# Patient Record
Sex: Female | Born: 1985 | Race: White | Hispanic: No | Marital: Married | Smoking: Never smoker
Health system: Southern US, Community
[De-identification: ages and names within clinical notes are randomized; demographics above are authoritative.]

## PROBLEM LIST (undated history)

## (undated) DIAGNOSIS — F32A Depression, unspecified: Secondary | ICD-10-CM

## (undated) DIAGNOSIS — Z789 Other specified health status: Secondary | ICD-10-CM

## (undated) HISTORY — PX: WISDOM TOOTH EXTRACTION: SHX21

---

## 2018-09-16 ENCOUNTER — Other Ambulatory Visit: Payer: Self-pay | Admitting: Family Medicine

## 2018-09-16 DIAGNOSIS — R1013 Epigastric pain: Secondary | ICD-10-CM

## 2018-09-23 ENCOUNTER — Ambulatory Visit
Admission: RE | Admit: 2018-09-23 | Discharge: 2018-09-23 | Disposition: A | Discharge status: Home / Self Care | Payer: BLUE CROSS/BLUE SHIELD | Source: Ambulatory Visit | Attending: Family Medicine | Admitting: Family Medicine

## 2018-09-23 DIAGNOSIS — R1013 Epigastric pain: Secondary | ICD-10-CM | POA: Diagnosis not present

## 2020-04-11 ENCOUNTER — Other Ambulatory Visit: Payer: Self-pay | Admitting: Obstetrics and Gynecology

## 2020-04-24 NOTE — H&P (Signed)
[name] is a 34 y.o. female here for Sono guided IUD removal . Pt not seen since Sept 2020 and she is getting married in Sept 2021 .  She has a Mirena placed 5 yrs ago . No reported difficulty with placement   Past Medical History:  has a past medical history of Thyroid disease.  Past Surgical History:  has a past surgical history that includes Colposcopy. Family History: family history includes Breast cancer (age of onset: 73) in her mother; Lung cancer in her maternal grandmother. Social History:  reports that she has never smoked. She has never used smokeless tobacco. She reports current alcohol use. She reports that she does not use drugs. OB/GYN History:          OB History    Gravida  0   Para  0   Term  0   Preterm  0   AB  0   Living  0     SAB  0   TAB  0   Ectopic  0   Molar  0   Multiple  0   Live Births  0          Allergies: has No Known Allergies. Medications:  Current Outpatient Medications:  .  acyclovir (ZOVIRAX) 400 MG tablet, TAKE 1 TABLET BY MOUTH ONCE DAILY, Disp: 90 tablet, Rfl: 3 .  ibuprofen (MOTRIN) 200 MG tablet, Take 200 mg by mouth every 6 (six) hours as needed for Pain, Disp: , Rfl:  .  levonorgestrel (MIRENA 52 MG) 20 mcg/24 hr (5 years) IUD, Insert 1 each into the uterus once Follow package directions. Placed 07/2015  , Disp: , Rfl:   Review of Systems: General:                      No fatigue or weight loss Eyes:                           No vision changes Ears:                            No hearing difficulty Respiratory:                No cough or shortness of breath Pulmonary:                  No asthma or shortness of breath Cardiovascular:           No chest pain, palpitations, dyspnea on exertion Gastrointestinal:          No abdominal bloating, chronic diarrhea, constipations, masses, pain or hematochezia Genitourinary:             No hematuria, dysuria, abnormal vaginal discharge, pelvic pain,  Menometrorrhagia Lymphatic:                   No swollen lymph nodes Musculoskeletal:         No muscle weakness Neurologic:                  No extremity weakness, syncope, seizure disorder Psychiatric:                  No history of depression, delusions or suicidal/homicidal ideation    Exam:      Vitals:   04/09/20 1607  BP: (!) 123/101  Pulse:     Body mass index  is 22.42 kg/m.  WDWN white female in NAD   Lungs: CTA  CV : RRR without murmur   Abdomen: soft , no mass, normal active bowel sounds,  non-tender, no rebound tenderness Pelvic: tanner stage 5 ,  External genitalia: vulva /labia no lesions Urethra: no prolapse Vagina: normal physiologic d/c Cervix: no lesions, no cervical motion tenderness Cervix cannulated  And strings not retrieved . IUD + randall stone used . Cervical dilators used as well . After several attempts all with u/s guidance the iud could not be removed .    Uterus: normal size shape and contour, non-tender Adnexa: no mass,  non-tender   Rectovaginal:  U/S: Result status: In process  Endometrium= iud seen in fundus    Guidance for removal of iud   iud Not retrieved     Impression:   The encounter diagnosis was Retained intrauterine contraceptive device (IUD).    Plan:   Unsuccessful retrieval of IUD  Spoke to her about removal in the OR where the patient will be more comfortable      Gaspar Fowle Elenora Gamma, MD

## 2020-04-30 ENCOUNTER — Other Ambulatory Visit: Payer: Self-pay

## 2020-04-30 ENCOUNTER — Encounter: Payer: Self-pay | Admitting: *Deleted

## 2020-04-30 ENCOUNTER — Encounter
Admission: RE | Admit: 2020-04-30 | Discharge: 2020-04-30 | Disposition: A | Discharge status: Home / Self Care | Payer: BC Managed Care – PPO | Source: Ambulatory Visit | Attending: Obstetrics and Gynecology | Admitting: Obstetrics and Gynecology

## 2020-04-30 HISTORY — DX: Other specified health status: Z78.9

## 2020-04-30 NOTE — Patient Instructions (Signed)
Your procedure is scheduled on: 05-04-20 FRIDAY Report to Same Day Surgery 2nd floor medical mall [facility] Entrance-take elevator on left to 2nd floor.  Check in with surgery information desk.) To find out your arrival time please call [phone] between 1PM - 3PM on 05-03-20 THURSDAY  Remember: Instructions that are not followed completely may result in serious medical risk, up to and including death, or upon the discretion of your surgeon and anesthesiologist your surgery may need to be rescheduled.    _x___ 1. Do not eat food after midnight the night before your procedure. NO GUM OR CANDY AFTER MIDNIGHT. You may drink clear liquids up to 2 hours before you are scheduled to arrive at the hospital for your procedure.  Do not drink clear liquids within 2 hours of your scheduled arrival to the hospital.  Clear liquids include  --Water or Apple juice without pulp  --Gatorade  --Black Coffee or Clear Tea (No milk, no creamers, do not add anything to the coffee or Tea  _X___Ensure clear carbohydrate drink-FINISH DRINK 2 HOURS PRIOR TO ARRIVAL TIME TO HOSPITAL THE DAY OF YOUR SURGERY    __x__ 2. No Alcohol for 24 hours before or after surgery.   __x__3. No Smoking or e-cigarettes for 24 prior to surgery.  Do not use any chewable tobacco products for at least 6 hour prior to surgery   ____  4. Bring all medications with you on the day of surgery if instructed.    __x__ 5. Notify your doctor if there is any change in your medical condition     (cold, fever, infections).    x___6. On the morning of surgery brush your teeth with toothpaste and water.  You may rinse your mouth with mouth wash if you wish.  Do not swallow any toothpaste or mouthwash.   Do not wear jewelry, make-up, hairpins, clips or nail polish.  Do not wear lotions, powders, or perfumes. You may wear deodorant.  Do not shave 48 hours prior to surgery. Men may shave face and neck.  Do not bring valuables to the hospital.     [facility] is not responsible for any belongings or valuables.               Contacts, dentures or bridgework may not be worn into surgery.  Leave your suitcase in the car. After surgery it may be brought to your room.  For patients admitted to the hospital, discharge time is determined by your  treatment team.  _  Patients discharged the day of surgery will not be allowed to drive home.  You will need someone to drive you home and stay with you the night of your procedure.    Please read over the following fact sheets that you were given:   [facility] Preparing for Surgery/INCENTIVE SPIROMETER INSTRUCTIONS  ____ TAKE THE FOLLOWING MEDICATION THE MORNING OF SURGERY WITH A SMALL SIP OF WATER. These include:  1. NONE  2.  3.  4.  5.  6.  ____Fleets enema or Magnesium Citrate as directed.   ____ Use CHG Soap or sage wipes as directed on instruction sheet   ____ Use inhalers on the day of surgery and bring to hospital day of surgery  ____ Stop Metformin and Janumet 2 days prior to surgery.    ____ Take 1/2 of usual insulin dose the night before surgery and none on the morning surgery.   ____ Follow recommendations from Cardiologist, Pulmonologist or PCP regarding stopping  Aspirin, Coumadin, Plavix ,Eliquis, Effient, or Pradaxa, and Pletal.  X____Stop Anti-inflammatories such as Advil, Aleve, Ibuprofen, Motrin, Naproxen, Naprosyn, Goodies powders or aspirin products NOW-OK to take Tylenol   ____ Stop supplements until after surgery.    ____ Bring C-Pap to the hospital.

## 2020-05-02 ENCOUNTER — Other Ambulatory Visit
Admission: RE | Admit: 2020-05-02 | Discharge: 2020-05-02 | Disposition: A | Discharge status: Home / Self Care | Payer: BC Managed Care – PPO | Source: Ambulatory Visit | Attending: Obstetrics and Gynecology | Admitting: Obstetrics and Gynecology

## 2020-05-02 ENCOUNTER — Other Ambulatory Visit: Payer: Self-pay

## 2020-05-02 DIAGNOSIS — Z30432 Encounter for removal of intrauterine contraceptive device: Secondary | ICD-10-CM | POA: Diagnosis not present

## 2020-05-02 DIAGNOSIS — Z20822 Contact with and (suspected) exposure to covid-19: Secondary | ICD-10-CM | POA: Insufficient documentation

## 2020-05-02 DIAGNOSIS — Z01812 Encounter for preprocedural laboratory examination: Secondary | ICD-10-CM | POA: Insufficient documentation

## 2020-05-02 DIAGNOSIS — Z79899 Other long term (current) drug therapy: Secondary | ICD-10-CM | POA: Diagnosis not present

## 2020-05-02 LAB — BASIC METABOLIC PANEL
Anion gap: 10 (ref 5–15)
BUN: 15 mg/dL (ref 6–20)
CO2: 23 mmol/L (ref 22–32)
Calcium: 9.2 mg/dL (ref 8.9–10.3)
Chloride: 105 mmol/L (ref 98–111)
Creatinine, Ser: 0.83 mg/dL (ref 0.44–1.00)
GFR calc Af Amer: >60 mL/min (ref >60)
GFR calc non Af Amer: >60 mL/min (ref >60)
Glucose, Bld: 87 mg/dL (ref 70–99)
Potassium: 3.4 mmol/L — ABNORMAL LOW (ref 3.5–5.1)
Sodium: 138 mmol/L (ref 135–145)

## 2020-05-02 LAB — CBC
HCT: 37.7 % (ref 36.0–46.0)
Hemoglobin: 12.4 g/dL (ref 12.0–15.0)
MCH: 29.9 pg (ref 26.0–34.0)
MCHC: 32.9 g/dL (ref 30.0–36.0)
MCV: 90.8 fL (ref 80.0–100.0)
Platelets: 255 10*3/uL (ref 150–400)
RBC: 4.15 MIL/uL (ref 3.87–5.11)
RDW: 12.5 % (ref 11.5–15.5)
WBC: 6.5 10*3/uL (ref 4.0–10.5)
nRBC: 0 % (ref 0.0–0.2)

## 2020-05-02 LAB — TYPE AND SCREEN
ABO/RH(D): A POS
Antibody Screen: NEGATIVE

## 2020-05-02 LAB — SARS CORONAVIRUS 2 (TAT 6-24 HRS): SARS Coronavirus 2: NEGATIVE

## 2020-05-04 ENCOUNTER — Encounter: Payer: Self-pay | Admitting: Obstetrics and Gynecology

## 2020-05-04 ENCOUNTER — Ambulatory Visit: Payer: BC Managed Care – PPO | Admitting: Family

## 2020-05-04 ENCOUNTER — Ambulatory Visit
Admission: RE | Admit: 2020-05-04 | Discharge: 2020-05-04 | Disposition: A | Discharge status: Home / Self Care | Payer: BC Managed Care – PPO | Source: Ambulatory Visit | Attending: Obstetrics and Gynecology | Admitting: Obstetrics and Gynecology

## 2020-05-04 ENCOUNTER — Other Ambulatory Visit: Payer: Self-pay

## 2020-05-04 ENCOUNTER — Encounter
Admission: RE | Disposition: A | Discharge status: Home / Self Care | Payer: Self-pay | Source: Ambulatory Visit | Attending: Obstetrics and Gynecology

## 2020-05-04 DIAGNOSIS — Z79899 Other long term (current) drug therapy: Secondary | ICD-10-CM | POA: Insufficient documentation

## 2020-05-04 DIAGNOSIS — Z30432 Encounter for removal of intrauterine contraceptive device: Secondary | ICD-10-CM | POA: Insufficient documentation

## 2020-05-04 HISTORY — PX: HYSTEROSCOPY: SHX211

## 2020-05-04 HISTORY — PX: IUD REMOVAL: SHX5392

## 2020-05-04 LAB — POCT PREGNANCY, URINE: Preg Test, Ur: NEGATIVE

## 2020-05-04 SURGERY — REMOVAL, INTRAUTERINE DEVICE
Anesthesia: General | Site: Uterus

## 2020-05-04 MED ORDER — FENTANYL CITRATE (PF) 100 MCG/2ML IJ SOLN
INTRAMUSCULAR | Status: AC
  Filled 2020-05-04: qty 2

## 2020-05-04 MED ORDER — FAMOTIDINE 20 MG PO TABS: 20.0000 mg | ORAL_TABLET | Freq: Once | ORAL | Status: DC

## 2020-05-04 MED ORDER — FENTANYL CITRATE (PF) 100 MCG/2ML IJ SOLN: 25.0000 ug | INTRAMUSCULAR | Status: DC | PRN

## 2020-05-04 MED ORDER — LACTATED RINGERS IV SOLN: INTRAVENOUS | Status: DC

## 2020-05-04 MED ORDER — PROPOFOL 10 MG/ML IV BOLUS
INTRAVENOUS | Status: AC
  Filled 2020-05-04: qty 40

## 2020-05-04 MED ORDER — SILVER NITRATE-POT NITRATE 75-25 % EX MISC
CUTANEOUS | Status: AC
  Filled 2020-05-04: qty 10

## 2020-05-04 MED ORDER — MIDAZOLAM HCL 2 MG/2ML IJ SOLN
INTRAMUSCULAR | Status: AC
  Filled 2020-05-04: qty 2

## 2020-05-04 MED ORDER — MIDAZOLAM HCL 2 MG/2ML IJ SOLN
INTRAMUSCULAR | Status: DC | PRN
  Administered 2020-05-04: 2 mg via INTRAVENOUS

## 2020-05-04 MED ORDER — KETOROLAC TROMETHAMINE 30 MG/ML IJ SOLN
INTRAMUSCULAR | Status: AC
  Filled 2020-05-04: qty 1

## 2020-05-04 MED ORDER — KETOROLAC TROMETHAMINE 30 MG/ML IJ SOLN
INTRAMUSCULAR | Status: DC | PRN
  Administered 2020-05-04: 30 mg via INTRAVENOUS

## 2020-05-04 MED ORDER — FERRIC SUBSULFATE 259 MG/GM EX SOLN
CUTANEOUS | Status: AC
  Filled 2020-05-04: qty 8

## 2020-05-04 MED ORDER — ONDANSETRON HCL 4 MG/2ML IJ SOLN
INTRAMUSCULAR | Status: DC | PRN
  Administered 2020-05-04: 4 mg via INTRAVENOUS

## 2020-05-04 MED ORDER — LACTATED RINGERS IR SOLN
Status: DC | PRN
  Administered 2020-05-04: 3000 mL

## 2020-05-04 MED ORDER — CHLORHEXIDINE GLUCONATE 0.12 % MT SOLN
OROMUCOSAL | Status: AC
  Filled 2020-05-04: qty 15

## 2020-05-04 MED ORDER — ONDANSETRON HCL 4 MG/2ML IJ SOLN
INTRAMUSCULAR | Status: AC
  Filled 2020-05-04: qty 2

## 2020-05-04 MED ORDER — POVIDONE-IODINE 10 % EX SWAB: 2.0000 "application " | Freq: Once | CUTANEOUS | Status: DC

## 2020-05-04 MED ORDER — CHLORHEXIDINE GLUCONATE 0.12 % MT SOLN
15.0000 mL | Freq: Once | OROMUCOSAL | Status: AC
  Administered 2020-05-04: 15 mL via OROMUCOSAL

## 2020-05-04 MED ORDER — FENTANYL CITRATE (PF) 100 MCG/2ML IJ SOLN
INTRAMUSCULAR | Status: AC
  Administered 2020-05-04: 25 ug via INTRAVENOUS
  Filled 2020-05-04: qty 2

## 2020-05-04 MED ORDER — FENTANYL CITRATE (PF) 100 MCG/2ML IJ SOLN
INTRAMUSCULAR | Status: DC | PRN
  Administered 2020-05-04 (×2): 50 ug via INTRAVENOUS

## 2020-05-04 MED ORDER — DEXAMETHASONE SODIUM PHOSPHATE 10 MG/ML IJ SOLN
INTRAMUSCULAR | Status: DC | PRN
  Administered 2020-05-04: 5 mg via INTRAVENOUS

## 2020-05-04 MED ORDER — ORAL CARE MOUTH RINSE: 15.0000 mL | Freq: Once | OROMUCOSAL | Status: AC

## 2020-05-04 MED ORDER — ONDANSETRON HCL 4 MG/2ML IJ SOLN: 4.0000 mg | Freq: Once | INTRAMUSCULAR | Status: DC | PRN

## 2020-05-04 SURGICAL SUPPLY — 19 items
APPLICATOR SWAB PROCTO LG 16IN (MISCELLANEOUS) ×2 IMPLANT
CANISTER SUCT 3000ML PPV (MISCELLANEOUS) ×2 IMPLANT
CATH ROBINSON RED A/P 16FR (CATHETERS) ×2 IMPLANT
COVER WAND RF STERILE (DRAPES) ×2 IMPLANT
ELECT REM PT RETURN 9FT ADLT (ELECTROSURGICAL) ×2
ELECTRODE REM PT RTRN 9FT ADLT (ELECTROSURGICAL) ×1 IMPLANT
GLOVE SURG SYN 8.0 (GLOVE) ×2 IMPLANT
GOWN STRL REUS W/ TWL LRG LVL3 (GOWN DISPOSABLE) ×1 IMPLANT
GOWN STRL REUS W/ TWL XL LVL3 (GOWN DISPOSABLE) ×1 IMPLANT
GOWN STRL REUS W/TWL LRG LVL3 (GOWN DISPOSABLE) ×2
GOWN STRL REUS W/TWL XL LVL3 (GOWN DISPOSABLE) ×2
IV LACTATED RINGER IRRG 3000ML (IV SOLUTION) ×2
IV LR IRRIG 3000ML ARTHROMATIC (IV SOLUTION) ×1 IMPLANT
KIT TURNOVER CYSTO (KITS) ×2 IMPLANT
PACK DNC HYST (MISCELLANEOUS) ×2 IMPLANT
PAD OB MATERNITY 4.3X12.25 (PERSONAL CARE ITEMS) ×2 IMPLANT
PAD PREP 24X41 OB/GYN DISP (PERSONAL CARE ITEMS) ×2 IMPLANT
TOWEL OR 17X26 4PK STRL BLUE (TOWEL DISPOSABLE) ×2 IMPLANT
TUBING CONNECTING 10 (TUBING) ×2 IMPLANT

## 2020-05-04 NOTE — Progress Notes (Signed)
Pt here for cervical dilation and removal of IUD   LAbs reviewed  All questions answered .. proceed

## 2020-05-04 NOTE — Anesthesia Preprocedure Evaluation (Signed)
Anesthesia Evaluation  Patient identified by MRN, date of birth, ID band Patient awake    Reviewed: Allergy & Precautions, NPO status , Patient's Chart, lab work & pertinent test results  History of Anesthesia Complications Negative for: history of anesthetic complications  Airway Mallampati: II       Dental   Pulmonary neg sleep apnea, neg COPD, Not current smoker,           Cardiovascular (-) hypertension(-) Past MI and (-) CHF (-) dysrhythmias (-) Valvular Problems/Murmurs     Neuro/Psych neg Seizures    GI/Hepatic Neg liver ROS, neg GERD  ,  Endo/Other  neg diabetes  Renal/GU negative Renal ROS     Musculoskeletal   Abdominal   Peds  Hematology   Anesthesia Other Findings   Reproductive/Obstetrics                             Anesthesia Physical Anesthesia Plan  ASA: I  Anesthesia Plan: General   Post-op Pain Management:    Induction: Intravenous  PONV Risk Score and Plan: 3 and Ondansetron, Dexamethasone and Midazolam  Airway Management Planned: LMA  Additional Equipment:   Intra-op Plan:   Post-operative Plan:   Informed Consent: I have reviewed the patients History and Physical, chart, labs and discussed the procedure including the risks, benefits and alternatives for the proposed anesthesia with the patient or authorized representative who has indicated his/her understanding and acceptance.       Plan Discussed with:   Anesthesia Plan Comments:         Anesthesia Quick Evaluation  

## 2020-05-04 NOTE — Anesthesia Postprocedure Evaluation (Signed)
Anesthesia Post Note  Patient: Samantha Newman  Procedure(s) Performed: INTRAUTERINE DEVICE (IUD) REMOVAL (N/A Uterus) HYSTEROSCOPY (N/A Uterus)  Patient location during evaluation: PACU Anesthesia Type: General Level of consciousness: awake and alert Pain management: pain level controlled Vital Signs Assessment: post-procedure vital signs reviewed and stable Respiratory status: spontaneous breathing and respiratory function stable Cardiovascular status: stable Anesthetic complications: no   No complications documented.   Last Vitals:  Vitals:   05/04/20 1117 05/04/20 1122  BP:    Pulse: 61 54  Resp: 13 12  Temp:    SpO2: 100% 100%    Last Pain:  Vitals:   05/04/20 1122  TempSrc:   PainSc: 2                  Kadedra Vanaken K

## 2020-05-04 NOTE — Brief Op Note (Signed)
05/04/2020  10:56 AM  PATIENT:  Lyndel Pleasure  34 y.o. female  PRE-OPERATIVE DIAGNOSIS:  endometrial IUD retained  POST-OPERATIVE DIAGNOSIS:  endometrial IUD retained  PROCEDURE:  Procedure(s): INTRAUTERINE DEVICE (IUD) REMOVAL (N/A) HYSTEROSCOPY (N/A) Cervical dilation SURGEON:  Surgeon(s) and Role:    * Zalmen Wrightsman, Ihor Austin, MD - Primary  PHYSICIAN ASSISTANT: RN  ASSISTANTS: none   ANESTHESIA:   general  EBL:  5 mL IOF 750 cc  UO 150 cc  BLOOD ADMINISTERED:none  DRAINS: none   LOCAL MEDICATIONS USED:  NONE  SPECIMEN:  No Specimen, Picture of IUD taken out of body    DISPOSITION OF SPECIMEN:  N/A  COUNTS:  YES  TOURNIQUET:  * No tourniquets in log *  DICTATION: .Other Dictation: Dictation Number verbal  PLAN OF CARE: Discharge to home after PACU  PATIENT DISPOSITION:  PACU - hemodynamically stable.   Delay start of Pharmacological VTE agent (>24hrs) due to surgical blood loss or risk of bleeding: not applicable

## 2020-05-04 NOTE — Anesthesia Procedure Notes (Addendum)
Procedure Name: LMA Insertion Date/Time: 05/04/2020 10:29 AM Performed by: Manning Charity, CRNA Pre-anesthesia Checklist: Patient identified, Emergency Drugs available, Suction available and Patient being monitored Patient Re-evaluated:Patient Re-evaluated prior to induction Oxygen Delivery Method: Circle system utilized Preoxygenation: Pre-oxygenation with 100% oxygen Induction Type: IV induction Ventilation: Mask ventilation without difficulty LMA: LMA inserted LMA Size: 3.0 Number of attempts: 1 Placement Confirmation: positive ETCO2 and breath sounds checked- equal and bilateral Tube secured with: Tape Dental Injury: Teeth and Oropharynx as per pre-operative assessment

## 2020-05-04 NOTE — Discharge Instructions (Signed)

## 2020-05-04 NOTE — Op Note (Signed)
Samantha, Newman MEDICAL RECORD [id number] ACCOUNT [id number] DATE OF BIRTH:03/29/1986 FACILITY: [facility] LOCATION: [facility] PHYSICIAN:Timmy Cleverly Cloyde Reams, MD  OPERATIVE REPORT  DATE OF PROCEDURE:  05/04/2020  PREOPERATIVE DIAGNOSIS:  Retained endometrial intrauterine device.  POSTOPERATIVE DIAGNOSIS:  Retained endometrial intrauterine device.  PROCEDURE: 1.  Cervical dilation. 2.  IUD retrieval. 3.  Hysteroscopy.  SURGEON:  Jennell Corner, MD  ANESTHESIA:  General endotracheal anesthesia.  INDICATIONS:  A 34 year old gravida 0 patient with a retained intrauterine device that could not be removed in the office setting.  DESCRIPTION OF PROCEDURE:  After adequate general endotracheal anesthesia, the patient was placed in dorsal supine position with the legs in the candy cane stirrups.  Timeout was performed.  The patient was previously prepped and draped in normal sterile  fashion.  A weighted speculum was placed in the posterior vaginal vault and the anterior cervix was grasped with a single tooth tenaculum.  Cervix was then gently dilated to #15 Hanks dilator.  Randall stone forceps were then advanced into the  endometrial cavity and the intrauterine device with a short string was retrieved.  Picture taken on the operating table.  The cervix was then dilated to #18 Hanks dilator and the hysteroscope was then advanced into the endometrial cavity.  No defects  were noted.  Normal ostia were seen bilaterally and the hysteroscope was removed.  COMPLICATIONS:  There were no complications.  ESTIMATED BLOOD LOSS:  Minimal.  INTRAOPERATIVE FLUIDS:  750 mL.  URINE OUTPUT:  150 mL.  DISPOSITION:  The patient was taken to recovery room in good condition.  CN/NUANCE  D:05/04/2020 T:05/04/2020 JOB:[id number]

## 2020-05-04 NOTE — Transfer of Care (Signed)
Immediate Anesthesia Transfer of Care Note  Patient: Samantha Newman  Procedure(s) Performed: INTRAUTERINE DEVICE (IUD) REMOVAL (N/A Uterus) HYSTEROSCOPY (N/A Uterus)  Patient Location: PACU  Anesthesia Type:General  Level of Consciousness: awake, alert , drowsy and patient cooperative  Airway & Oxygen Therapy: Patient Spontanous Breathing and Patient connected to face mask oxygen  Post-op Assessment: Report given to RN and Post -op Vital signs reviewed and stable  Post vital signs: Reviewed and stable  Last Vitals:  Vitals Value Taken Time  BP 111/71 05/04/20 1110  Temp 36 C 05/04/20 1108  Pulse 70 05/04/20 1111  Resp 11 05/04/20 1111  SpO2 100 % 05/04/20 1111  Vitals shown include unvalidated device data.  Last Pain:  Vitals:   05/04/20 0846  TempSrc: Tympanic  PainSc: 0-No pain         Complications: No complications documented.

## 2020-05-05 ENCOUNTER — Encounter: Payer: Self-pay | Admitting: Obstetrics and Gynecology

## 2020-05-31 IMAGING — US US ABDOMEN LIMITED
1 series · 14 of 25 positions shown · non-contrast
Comparison: None.

CLINICAL DATA: Upper abdominal/epigastric pain

EXAM:
ULTRASOUND ABDOMEN LIMITED RIGHT UPPER QUADRANT

[Series 1: us abdomen limited · 0.19mm/px · 14 of 39 slices shown]
[im 1/39]
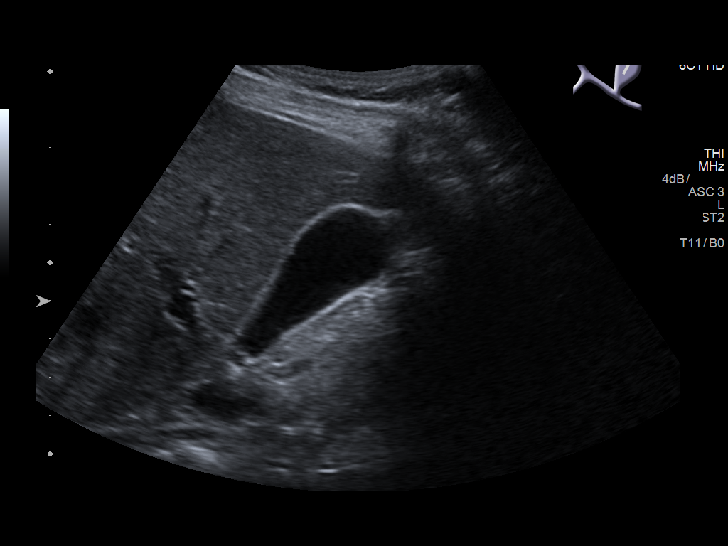
[im 4/39]
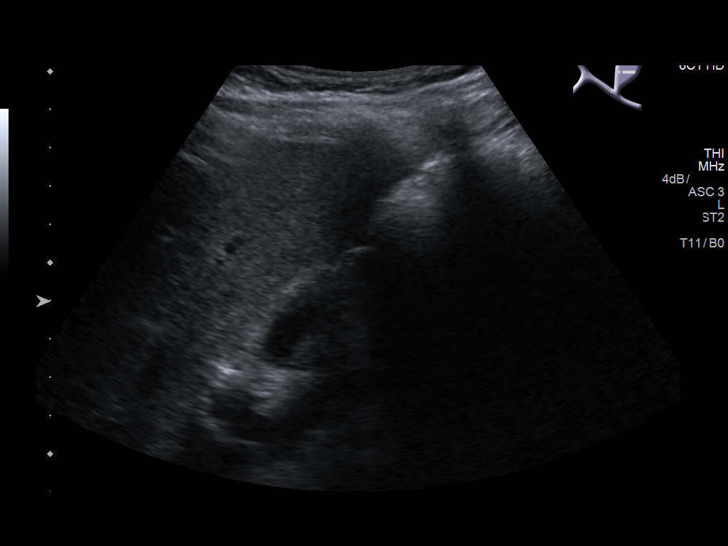
[im 7/39]
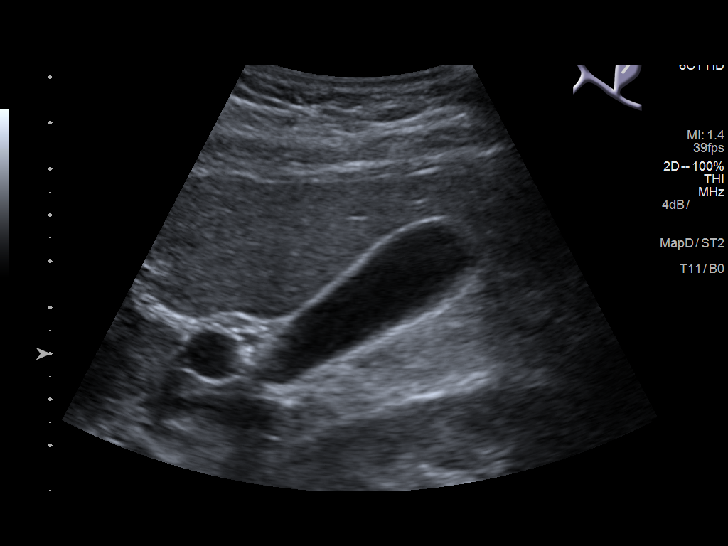
[im 10/39]
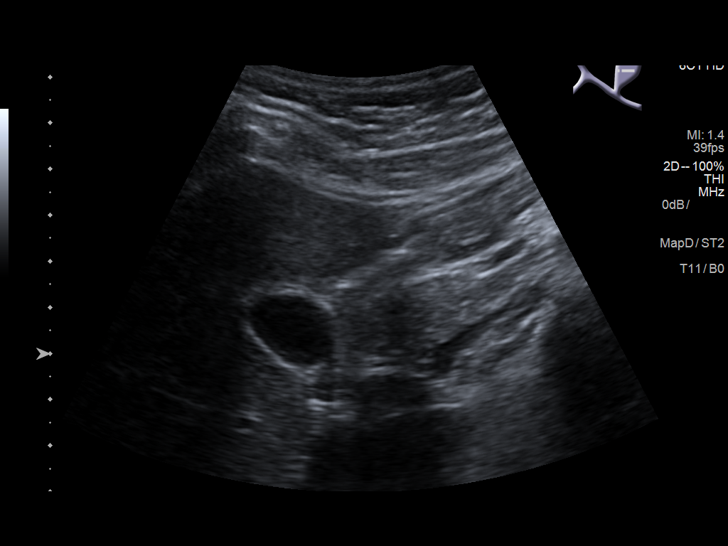
[im 13/39]
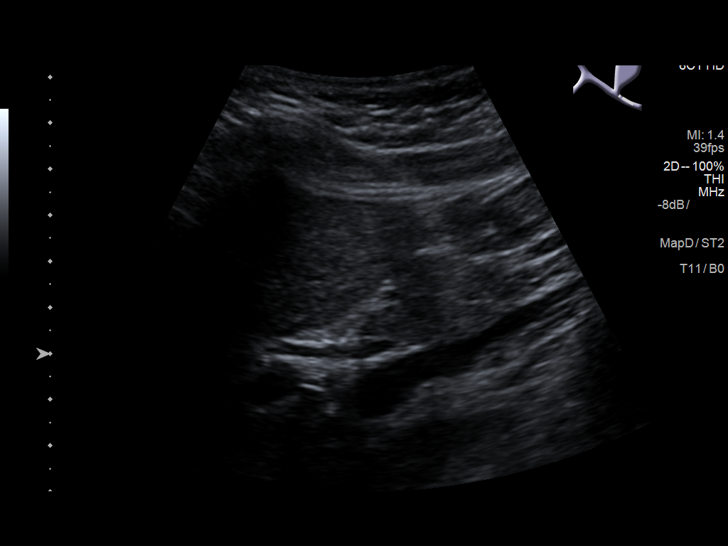
[im 15/39]
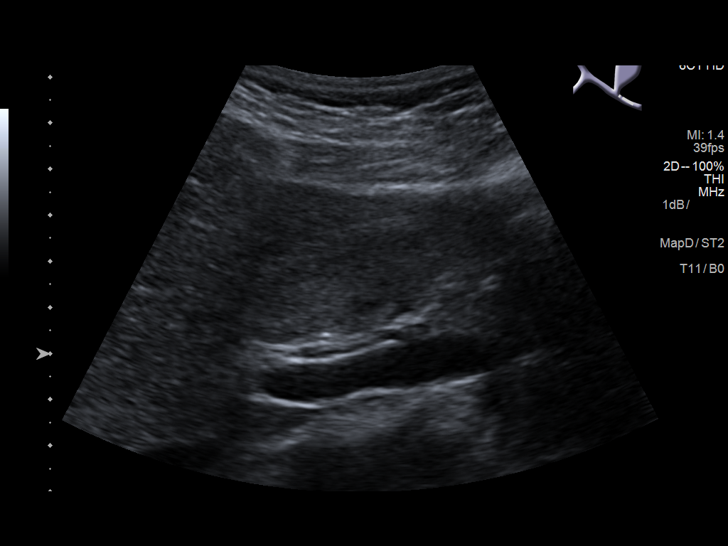
[im 18/39]
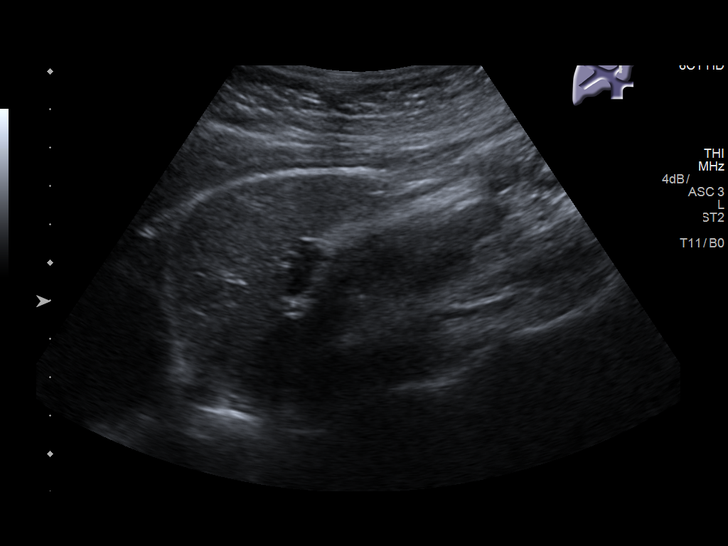
[im 21/39]
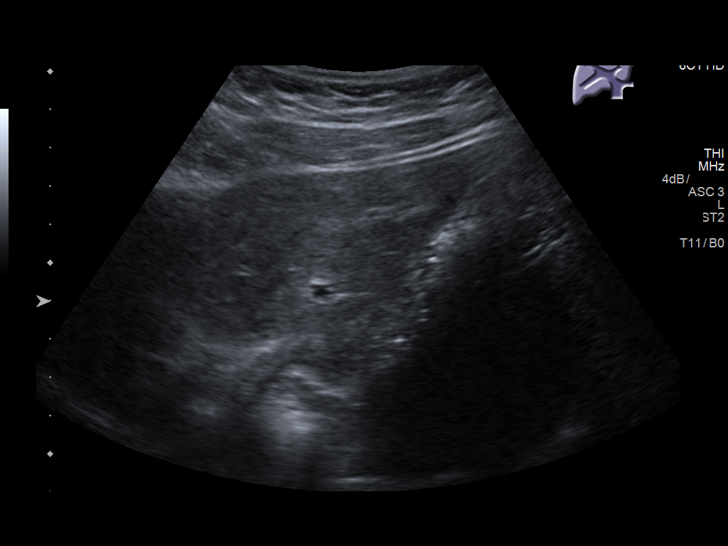
[im 24/39]
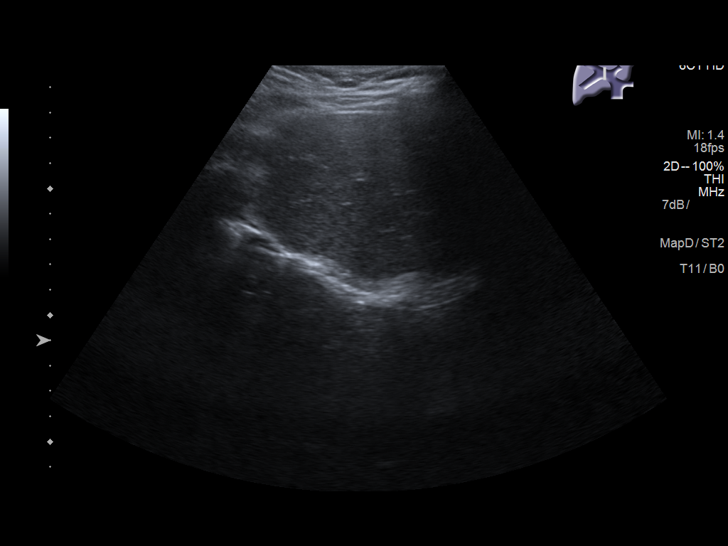
[im 26/39]
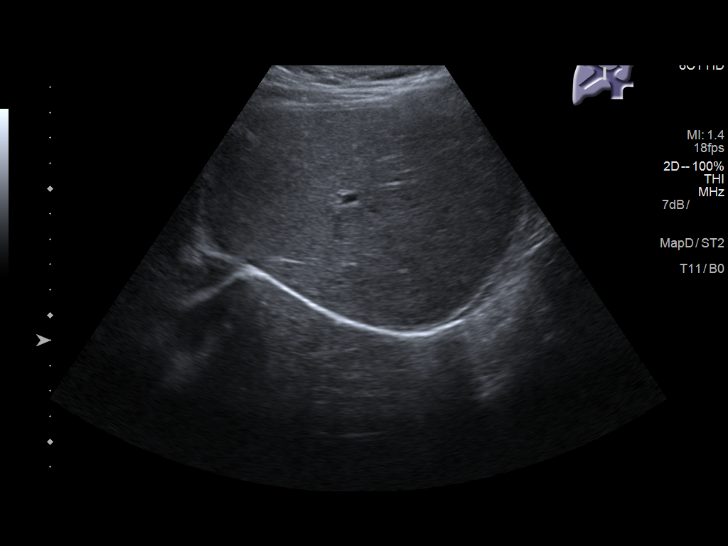
[im 29/39]
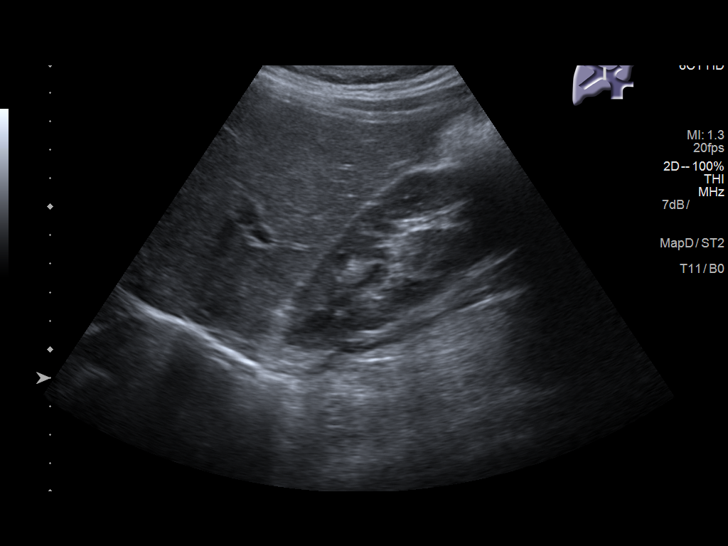
[im 32/39]
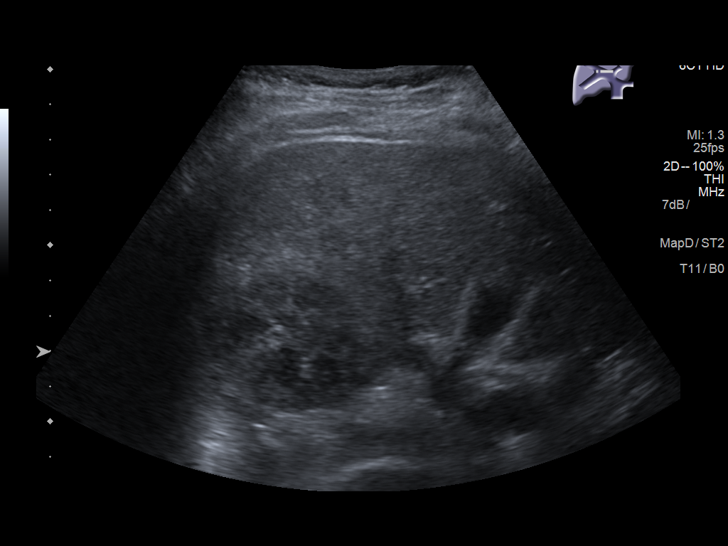
[im 35/39]
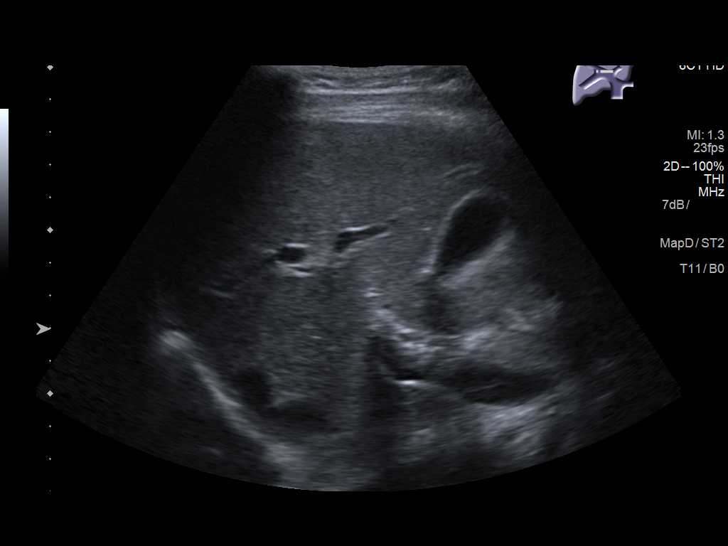
[im 39/39]
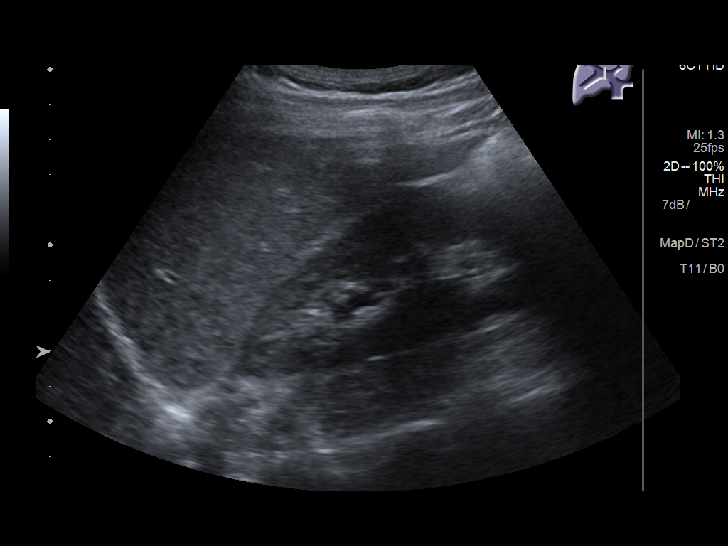

[14 of 25 positions shown; findings below may reference images not displayed]

FINDINGS: Gallbladder:

No gallstones or wall thickening visualized. There is no
pericholecystic fluid. No sonographic Murphy sign noted by
sonographer.

Common bile duct:

Diameter: 3 mm. No intrahepatic or extrahepatic biliary duct
dilatation.

Liver:

No focal lesion identified. Within normal limits in parenchymal
echogenicity. Portal vein is patent on color Doppler imaging with
normal direction of blood flow towards the liver.
IMPRESSION: Study within normal limits.

## 2020-08-28 ENCOUNTER — Ambulatory Visit (INDEPENDENT_AMBULATORY_CARE_PROVIDER_SITE_OTHER): Payer: BC Managed Care – PPO | Admitting: Obstetrics and Gynecology

## 2020-08-28 ENCOUNTER — Other Ambulatory Visit: Payer: Self-pay

## 2020-08-28 ENCOUNTER — Encounter: Payer: Self-pay | Admitting: Obstetrics and Gynecology

## 2020-08-28 VITALS — BP 143/93 | HR 102 | Ht 59.0 in | Wt 113.1 lb

## 2020-08-28 DIAGNOSIS — O09511 Supervision of elderly primigravida, first trimester: Secondary | ICD-10-CM

## 2020-08-28 DIAGNOSIS — N926 Irregular menstruation, unspecified: Secondary | ICD-10-CM | POA: Diagnosis not present

## 2020-08-28 LAB — POCT URINE PREGNANCY: Preg Test, Ur: POSITIVE — AB

## 2020-08-28 NOTE — Progress Notes (Signed)
HPI:      [name] is a 34 y.o. G1P0 who LMP was Patient's last menstrual period was 07/27/2020.  Subjective:   She presents today after missing her menstrual period.  She and her husband were attempting pregnancy.  She is currently taking prenatal vitamins.  She is experiencing no nausea and vomiting at this time.    Hx: The following portions of the patient's history were reviewed and updated as appropriate:             She  has a past medical history of Medical history non-contributory. She does not have a problem list on file. She  has a past surgical history that includes Wisdom tooth extraction; IUD removal (N/A, 05/04/2020); and Hysteroscopy (N/A, 05/04/2020). Her family history is not on file. She  reports that she has never smoked. She has never used smokeless tobacco. She reports current alcohol use. She reports that she does not use drugs. She has a current medication list which includes the following prescription(s): acyclovir. She has No Known Allergies.       Review of Systems:  Review of Systems  Constitutional: Denied constitutional symptoms, night sweats, recent illness, fatigue, fever, insomnia and weight loss.  Eyes: Denied eye symptoms, eye pain, photophobia, vision change and visual disturbance.  Ears/Nose/Throat/Neck: Denied ear, nose, throat or neck symptoms, hearing loss, nasal discharge, sinus congestion and sore throat.  Cardiovascular: Denied cardiovascular symptoms, arrhythmia, chest pain/pressure, edema, exercise intolerance, orthopnea and palpitations.  Respiratory: Denied pulmonary symptoms, asthma, pleuritic pain, productive sputum, cough, dyspnea and wheezing.  Gastrointestinal: Denied, gastro-esophageal reflux, melena, nausea and vomiting.  Genitourinary: Denied genitourinary symptoms including symptomatic vaginal discharge, pelvic relaxation issues, and urinary complaints.  Musculoskeletal: Denied musculoskeletal symptoms, stiffness, swelling,  muscle weakness and myalgia.  Dermatologic: Denied dermatology symptoms, rash and scar.  Neurologic: Denied neurology symptoms, dizziness, headache, neck pain and syncope.  Psychiatric: Denied psychiatric symptoms, anxiety and depression.  Endocrine: Denied endocrine symptoms including hot flashes and night sweats.   Meds:   Current Outpatient Medications on File Prior to Visit  Medication Sig Dispense Refill  . acyclovir (ZOVIRAX) 400 MG tablet Take 400 mg by mouth at bedtime.      No current facility-administered medications on file prior to visit.          Objective:     Vitals:   08/28/20 0740  BP: (!) 143/93  Pulse: (!) 102   Filed Weights   08/28/20 0740  Weight: 113 lb 1.6 oz (51.3 kg)              Urinary pregnancy test positive  Assessment:    G1P0 There are no problems to display for this patient.    1. Missed period   2. Primigravida of advanced maternal age in first trimester     Approximately [redacted] weeks pregnant based on LMP.  Patient doing well without problems at this time.   Plan:            Prenatal Plan 1.  The patient was given prenatal literature. 2.  She was continued on prenatal vitamins. 3.  A prenatal lab panel to be drawn at nurse visit. 4.  An ultrasound was ordered to better determine an EDC. 5.  A nurse visit was scheduled. 6.  Genetic testing and testing for other inheritable conditions discussed in detail. She will decide in the future whether to have these labs performed.  AMA risks discussed. 7.  A general overview of pregnancy testing, visit  schedule, ultrasound schedule, and prenatal care was discussed. 8.  COVID and its risks associated with pregnancy, prevention by limiting exposure and use of masks, as well as the risks and benefits of vaccination during pregnancy were discussed in detail.  [facility] policy regarding office and hospital visitation and testing was explained. 9.  Benefits of breast-feeding discussed in detail  including both maternal and infant benefits. Ready Set Baby website discussed.   Orders Orders Placed This Encounter  Procedures  . POC Urinalysis Dipstick OB    No orders of the defined types were placed in this encounter.     F/U  Return in about 8 weeks (around 10/23/2020). I spent 32 minutes involved in the care of this patient preparing to see the patient by obtaining and reviewing her medical history (including labs, imaging tests and prior procedures), documenting clinical information in the electronic health record (EHR), counseling and coordinating care plans, writing and sending prescriptions, ordering tests or procedures and directly communicating with the patient by discussing pertinent items from her history and physical exam as well as detailing my assessment and plan as noted above so that she has an informed understanding.  All of her questions were answered.  Elonda Husky, M.D. 08/28/2020 8:14 AM

## 2020-08-31 ENCOUNTER — Encounter (HOSPITAL_COMMUNITY): Payer: Self-pay | Admitting: Orthopaedic Surgery

## 2020-08-31 ENCOUNTER — Other Ambulatory Visit: Payer: Self-pay

## 2020-08-31 ENCOUNTER — Telehealth: Payer: Self-pay

## 2020-08-31 NOTE — Telephone Encounter (Signed)
Please advise 

## 2020-08-31 NOTE — Progress Notes (Signed)
Anesthesia Chart Review: SAME DAY WORK-UP   Case: [id number] Date/Time: 09/03/20 0930   Procedure: Right middle finger proximal phalanx fracture open reduction and internal fixation (Right ) -   Anesthesia type: General   Pre-op diagnosis: Right middle finger proximal phalanx fracture   [facility]OM 08 / [facility]   Surgeons: Ernest Mallick, MD      DISCUSSION: Patient is a 34 year old female scheduled for the above procedure. She is [redacted] weeks pregnant. Never smoker.   She was evaluated on 08/28/20 by OB/GYN Brennan Bailey, MD for missed menstrual period in setting of attempting to get pregnant. Urine pregnancy test was positive on 08/28/20. Based on LMP 07/27/20, gestational age 73w 3d on surgery date with EDD 05/03/21.   I spoke with Tresa Endo at Dr. Hinda Glatter office, and she has reached out to Dr. Michel Harrow office to notify him of surgery plans.   Preoperative COVID-19 test is scheduled for 09/01/20. Anesthesiologist to evaluate on the day of surgery and determine definitive anesthesia plan.   VS: LMP 07/27/2020   Wt Readings from Last 3 Encounters:  08/28/20 51.3 kg  05/04/20 49.9 kg   Temp Readings from Last 3 Encounters:  05/04/20 (!) 36.3 C (Temporal)   BP Readings from Last 3 Encounters:  08/28/20 (!) 143/93  05/04/20 112/78   Pulse Readings from Last 3 Encounters:  08/28/20 (!) 102  05/04/20 66    PROVIDERS: Wilford Corner, PA-C is PCP    LABS: For day of surgery as indicated. Labs unremarkable except K 3.4 on 05/02/20.   IMAGES: Xray right hand 08/08/20 ([facility]): "3 views of the right hand ordered and interpreted on today's visit shows  an oblique fracture to the proximal phalanx right middle finger. There is  no callus formation noted on today's visit. Films were also reviewed with  Dr. Cheri Fowler."   EKG: N/A   CV: N/A  Past Medical History:  Diagnosis Date  . Depression    during high school  . Medical history non-contributory      Past Surgical History:  Procedure Laterality Date  . HYSTEROSCOPY N/A 05/04/2020   Procedure: HYSTEROSCOPY;  Surgeon: Schermerhorn, Ihor Austin, MD;  Location: [facility] ORS;  Service: Gynecology;  Laterality: N/A;  . IUD REMOVAL N/A 05/04/2020   Procedure: INTRAUTERINE DEVICE (IUD) REMOVAL;  Surgeon: Schermerhorn, Ihor Austin, MD;  Location: [facility] ORS;  Service: Gynecology;  Laterality: N/A;  . WISDOM TOOTH EXTRACTION      MEDICATIONS: No current facility-administered medications for this encounter.   Marland Kitchen acyclovir (ZOVIRAX) 400 MG tablet  . Prenatal Vit-Fe Fumarate-FA (PRENATAL MULTIVITAMIN) TABS tablet    Shonna Chock, PA-C Surgical Short Stay/Anesthesiology [facility] Phone [phone] [facility] Phone [phone] 08/31/2020 4:17 PM

## 2020-08-31 NOTE — Progress Notes (Signed)
Spoke with pt for pre-op call. Pt denies cardiac history, HTN or Diabetes. Pt is approximately [redacted] weeks pregnant.  Covid test scheduled for Saturday. Pt instructed that once she has the test done, she needs to quarantine until Monday when she comes to the hospital. She voiced understanding.   Chart sent to Anesthesia PA for review due to pt being pregnant

## 2020-08-31 NOTE — Telephone Encounter (Signed)
Kelly from [employer] stated that this patient is having a minor surgery for fixation of finger fraction, and that it was protocol to inform patients OB if they were undergoing any procedure. Dr. Candi Leash is performing the surgery, if there is any questions or concerns call back number is [phone].

## 2020-08-31 NOTE — Anesthesia Preprocedure Evaluation (Addendum)
Anesthesia Evaluation  Patient identified by MRN, date of birth, ID band Patient awake    Reviewed: Allergy & Precautions, NPO status , Patient's Chart, lab work & pertinent test results  History of Anesthesia Complications Negative for: history of anesthetic complications  Airway Mallampati: II  TM Distance: >3 FB Neck ROM: Full    Dental no notable dental hx. (+) Dental Advisory Given   Pulmonary neg pulmonary ROS,    Pulmonary exam normal        Cardiovascular negative cardio ROS Normal cardiovascular exam     Neuro/Psych negative neurological ROS     GI/Hepatic negative GI ROS, Neg liver ROS,   Endo/Other  negative endocrine ROS  Renal/GU negative Renal ROS     Musculoskeletal   Abdominal   Peds  Hematology negative hematology ROS (+)   Anesthesia Other Findings   Reproductive/Obstetrics (+) Pregnancy                            Anesthesia Physical Anesthesia Plan  ASA: II  Anesthesia Plan: General   Post-op Pain Management:    Induction: Intravenous  PONV Risk Score and Plan: 3 and Ondansetron and Propofol infusion  Airway Management Planned: LMA  Additional Equipment:   Intra-op Plan:   Post-operative Plan: Extubation in OR  Informed Consent: I have reviewed the patients History and Physical, chart, labs and discussed the procedure including the risks, benefits and alternatives for the proposed anesthesia with the patient or authorized representative who has indicated his/her understanding and acceptance.     Dental advisory given  Plan Discussed with: Anesthesiologist, CRNA and Surgeon  Anesthesia Plan Comments: (PAT note written 08/31/2020 by Shonna Chock, PA-C. Patient is [redacted] weeks pregnant. Discussed options for anesthetic: brachial plexus block with sedation vs. GA.  Pt prefers GA. )      Anesthesia Quick Evaluation

## 2020-09-01 ENCOUNTER — Other Ambulatory Visit (HOSPITAL_COMMUNITY)
Admission: RE | Admit: 2020-09-01 | Discharge: 2020-09-01 | Disposition: A | Discharge status: Home / Self Care | Payer: BC Managed Care – PPO | Source: Ambulatory Visit | Attending: Orthopaedic Surgery | Admitting: Orthopaedic Surgery

## 2020-09-01 DIAGNOSIS — Z01812 Encounter for preprocedural laboratory examination: Secondary | ICD-10-CM | POA: Insufficient documentation

## 2020-09-01 DIAGNOSIS — S62612A Displaced fracture of proximal phalanx of right middle finger, initial encounter for closed fracture: Secondary | ICD-10-CM | POA: Diagnosis not present

## 2020-09-01 DIAGNOSIS — X58XXXA Exposure to other specified factors, initial encounter: Secondary | ICD-10-CM | POA: Diagnosis not present

## 2020-09-01 DIAGNOSIS — Z20822 Contact with and (suspected) exposure to covid-19: Secondary | ICD-10-CM | POA: Insufficient documentation

## 2020-09-01 DIAGNOSIS — O26891 Other specified pregnancy related conditions, first trimester: Secondary | ICD-10-CM | POA: Diagnosis not present

## 2020-09-01 DIAGNOSIS — Z3A01 Less than 8 weeks gestation of pregnancy: Secondary | ICD-10-CM | POA: Diagnosis not present

## 2020-09-01 LAB — SARS CORONAVIRUS 2 (TAT 6-24 HRS): SARS Coronavirus 2: NEGATIVE

## 2020-09-03 ENCOUNTER — Ambulatory Visit (HOSPITAL_COMMUNITY)
Admission: RE | Admit: 2020-09-03 | Discharge: 2020-09-03 | Disposition: A | Discharge status: Home / Self Care | Payer: BC Managed Care – PPO | Attending: Orthopaedic Surgery | Admitting: Orthopaedic Surgery

## 2020-09-03 ENCOUNTER — Encounter (HOSPITAL_COMMUNITY): Payer: Self-pay | Admitting: Orthopaedic Surgery

## 2020-09-03 ENCOUNTER — Ambulatory Visit (HOSPITAL_COMMUNITY): Payer: BC Managed Care – PPO

## 2020-09-03 ENCOUNTER — Telehealth: Payer: Self-pay

## 2020-09-03 ENCOUNTER — Encounter (HOSPITAL_COMMUNITY)
Admission: RE | Disposition: A | Discharge status: Home / Self Care | Payer: Self-pay | Source: Home / Self Care | Attending: Orthopaedic Surgery

## 2020-09-03 ENCOUNTER — Other Ambulatory Visit: Payer: Self-pay

## 2020-09-03 ENCOUNTER — Ambulatory Visit (HOSPITAL_COMMUNITY): Payer: BC Managed Care – PPO | Admitting: Vascular Surgery

## 2020-09-03 DIAGNOSIS — X58XXXA Exposure to other specified factors, initial encounter: Secondary | ICD-10-CM | POA: Insufficient documentation

## 2020-09-03 DIAGNOSIS — O26891 Other specified pregnancy related conditions, first trimester: Secondary | ICD-10-CM | POA: Diagnosis not present

## 2020-09-03 DIAGNOSIS — Z20822 Contact with and (suspected) exposure to covid-19: Secondary | ICD-10-CM | POA: Insufficient documentation

## 2020-09-03 DIAGNOSIS — Z3A01 Less than 8 weeks gestation of pregnancy: Secondary | ICD-10-CM | POA: Insufficient documentation

## 2020-09-03 DIAGNOSIS — S62612A Displaced fracture of proximal phalanx of right middle finger, initial encounter for closed fracture: Secondary | ICD-10-CM | POA: Insufficient documentation

## 2020-09-03 HISTORY — PX: OPEN REDUCTION INTERNAL FIXATION (ORIF) PROXIMAL PHALANX: SHX6235

## 2020-09-03 HISTORY — DX: Depression, unspecified: F32.A

## 2020-09-03 LAB — CBC
HCT: 35.3 % — ABNORMAL LOW (ref 36.0–46.0)
Hemoglobin: 11.6 g/dL — ABNORMAL LOW (ref 12.0–15.0)
MCH: 30.4 pg (ref 26.0–34.0)
MCHC: 32.9 g/dL (ref 30.0–36.0)
MCV: 92.4 fL (ref 80.0–100.0)
Platelets: 255 10*3/uL (ref 150–400)
RBC: 3.82 MIL/uL — ABNORMAL LOW (ref 3.87–5.11)
RDW: 12 % (ref 11.5–15.5)
WBC: 7 10*3/uL (ref 4.0–10.5)
nRBC: 0 % (ref 0.0–0.2)

## 2020-09-03 SURGERY — OPEN REDUCTION INTERNAL FIXATION (ORIF) PROXIMAL PHALANX
Anesthesia: General | Site: Finger | Laterality: Right

## 2020-09-03 MED ORDER — ACETAMINOPHEN 10 MG/ML IV SOLN
INTRAVENOUS | Status: AC
  Filled 2020-09-03: qty 100

## 2020-09-03 MED ORDER — FENTANYL CITRATE (PF) 100 MCG/2ML IJ SOLN
25.0000 ug | INTRAMUSCULAR | Status: DC | PRN
  Administered 2020-09-03 (×3): 25 ug via INTRAVENOUS

## 2020-09-03 MED ORDER — CHLORHEXIDINE GLUCONATE 0.12 % MT SOLN
OROMUCOSAL | Status: AC
  Administered 2020-09-03: 15 mL via OROMUCOSAL
  Filled 2020-09-03: qty 15

## 2020-09-03 MED ORDER — LIDOCAINE HCL (PF) 2 % IJ SOLN
INTRAMUSCULAR | Status: AC
  Filled 2020-09-03: qty 5

## 2020-09-03 MED ORDER — FENTANYL CITRATE (PF) 100 MCG/2ML IJ SOLN
INTRAMUSCULAR | Status: AC
  Filled 2020-09-03: qty 2

## 2020-09-03 MED ORDER — BUPIVACAINE HCL (PF) 0.25 % IJ SOLN
INTRAMUSCULAR | Status: AC
  Filled 2020-09-03: qty 30

## 2020-09-03 MED ORDER — PROMETHAZINE HCL 25 MG/ML IJ SOLN: 6.2500 mg | INTRAMUSCULAR | Status: DC | PRN

## 2020-09-03 MED ORDER — CHLORHEXIDINE GLUCONATE 4 % EX LIQD: 60.0000 mL | Freq: Once | CUTANEOUS | Status: DC

## 2020-09-03 MED ORDER — PROPOFOL 10 MG/ML IV BOLUS
INTRAVENOUS | Status: DC | PRN
  Administered 2020-09-03: 150 mg via INTRAVENOUS

## 2020-09-03 MED ORDER — CHLORHEXIDINE GLUCONATE 0.12 % MT SOLN: 15.0000 mL | Freq: Once | OROMUCOSAL | Status: AC

## 2020-09-03 MED ORDER — HYDROCODONE-ACETAMINOPHEN 5-325 MG PO TABS
1.0000 | ORAL_TABLET | Freq: Four times a day (QID) | ORAL | 0 refills | Status: DC | PRN
Start: 2020-09-03 — End: 2020-10-08

## 2020-09-03 MED ORDER — FENTANYL CITRATE (PF) 250 MCG/5ML IJ SOLN
INTRAMUSCULAR | Status: DC | PRN
  Administered 2020-09-03 (×2): 25 ug via INTRAVENOUS
  Administered 2020-09-03 (×3): 50 ug via INTRAVENOUS

## 2020-09-03 MED ORDER — DEXAMETHASONE SODIUM PHOSPHATE 10 MG/ML IJ SOLN
INTRAMUSCULAR | Status: DC | PRN
  Administered 2020-09-03: 5 mg via INTRAVENOUS

## 2020-09-03 MED ORDER — CEFAZOLIN SODIUM-DEXTROSE 2-4 GM/100ML-% IV SOLN
2.0000 g | INTRAVENOUS | Status: AC
  Administered 2020-09-03: 2 g via INTRAVENOUS
  Filled 2020-09-03: qty 100

## 2020-09-03 MED ORDER — LACTATED RINGERS IV SOLN: INTRAVENOUS | Status: DC

## 2020-09-03 MED ORDER — MIDAZOLAM HCL 2 MG/2ML IJ SOLN
INTRAMUSCULAR | Status: AC
  Filled 2020-09-03: qty 2

## 2020-09-03 MED ORDER — ONDANSETRON HCL 4 MG/2ML IJ SOLN
INTRAMUSCULAR | Status: AC
  Filled 2020-09-03: qty 2

## 2020-09-03 MED ORDER — 0.9 % SODIUM CHLORIDE (POUR BTL) OPTIME
TOPICAL | Status: DC | PRN
  Administered 2020-09-03: 1000 mL

## 2020-09-03 MED ORDER — ACETAMINOPHEN 10 MG/ML IV SOLN
INTRAVENOUS | Status: DC | PRN
  Administered 2020-09-03: 1000 mg via INTRAVENOUS

## 2020-09-03 MED ORDER — ORAL CARE MOUTH RINSE: 15.0000 mL | Freq: Once | OROMUCOSAL | Status: AC

## 2020-09-03 MED ORDER — DEXAMETHASONE SODIUM PHOSPHATE 10 MG/ML IJ SOLN
INTRAMUSCULAR | Status: AC
  Filled 2020-09-03: qty 1

## 2020-09-03 MED ORDER — ACETAMINOPHEN 500 MG PO TABS: 1000.0000 mg | ORAL_TABLET | Freq: Once | ORAL | Status: DC

## 2020-09-03 MED ORDER — POVIDONE-IODINE 10 % EX SWAB
2.0000 "application " | Freq: Once | CUTANEOUS | Status: AC
  Administered 2020-09-03: 2 via TOPICAL

## 2020-09-03 MED ORDER — LIDOCAINE 2% (20 MG/ML) 5 ML SYRINGE
INTRAMUSCULAR | Status: DC | PRN
  Administered 2020-09-03: 50 mg via INTRAVENOUS

## 2020-09-03 MED ORDER — PROPOFOL 10 MG/ML IV BOLUS
INTRAVENOUS | Status: AC
  Filled 2020-09-03: qty 20

## 2020-09-03 MED ORDER — BUPIVACAINE HCL (PF) 0.25 % IJ SOLN: INTRAMUSCULAR | Status: DC | PRN

## 2020-09-03 MED ORDER — ONDANSETRON HCL 4 MG/2ML IJ SOLN
INTRAMUSCULAR | Status: DC | PRN
  Administered 2020-09-03: 4 mg via INTRAVENOUS

## 2020-09-03 MED ORDER — FENTANYL CITRATE (PF) 250 MCG/5ML IJ SOLN
INTRAMUSCULAR | Status: AC
  Filled 2020-09-03: qty 5

## 2020-09-03 SURGICAL SUPPLY — 49 items
BIT DRILL 1.1X3.5 QK RELEA (DRILL) ×1 IMPLANT
BIT DRILL 1.1X3.5 QUICK RELEAS (DRILL) ×2
BNDG ELASTIC 3X5.8 VLCR STR LF (GAUZE/BANDAGES/DRESSINGS) ×2 IMPLANT
BNDG ESMARK 4X9 LF (GAUZE/BANDAGES/DRESSINGS) ×2 IMPLANT
BNDG GAUZE ELAST 4 BULKY (GAUZE/BANDAGES/DRESSINGS) ×2 IMPLANT
CORD BIPOLAR FORCEPS 12FT (ELECTRODE) ×2 IMPLANT
COVER SURGICAL LIGHT HANDLE (MISCELLANEOUS) ×2 IMPLANT
CUFF TOURN SGL QUICK 18X4 (TOURNIQUET CUFF) ×2 IMPLANT
DRAPE OEC MINIVIEW 54X84 (DRAPES) ×2 IMPLANT
GAUZE SPONGE 4X4 12PLY STRL (GAUZE/BANDAGES/DRESSINGS) ×2 IMPLANT
GAUZE XEROFORM 1X8 LF (GAUZE/BANDAGES/DRESSINGS) ×2 IMPLANT
GOWN STRL REUS W/ TWL LRG LVL3 (GOWN DISPOSABLE) ×2 IMPLANT
GOWN STRL REUS W/ TWL XL LVL3 (GOWN DISPOSABLE) ×1 IMPLANT
GOWN STRL REUS W/TWL LRG LVL3 (GOWN DISPOSABLE) ×2
GOWN STRL REUS W/TWL XL LVL3 (GOWN DISPOSABLE) ×1
KIT BASIN OR (CUSTOM PROCEDURE TRAY) ×2 IMPLANT
KIT TURNOVER KIT B (KITS) ×2 IMPLANT
MANIFOLD NEPTUNE II (INSTRUMENTS) ×2 IMPLANT
NEEDLE HYPO 25GX1X1/2 BEV (NEEDLE) ×2 IMPLANT
NS IRRIG 1000ML POUR BTL (IV SOLUTION) ×2 IMPLANT
PACK ORTHO EXTREMITY (CUSTOM PROCEDURE TRAY) ×2 IMPLANT
PAD ARMBOARD 7.5X6 YLW CONV (MISCELLANEOUS) ×4 IMPLANT
PAD CAST 3X4 CTTN HI CHSV (CAST SUPPLIES) ×1 IMPLANT
PADDING CAST COTTON 3X4 STRL (CAST SUPPLIES) ×1
PLATE BONE 3H T .8MM NS (Plate) ×1 IMPLANT
PLATE BONE T .8MM NS (Plate) ×2 IMPLANT
PLATE TACK (WIRE) ×2
SCREW BONE MD 1.5X11MM HEXA (Screw) ×1 IMPLANT
SCREW BONE MD LCKG 1.5X6MM HEX (Screw) ×2 IMPLANT
SCREW MD 1.5X11MM (Screw) ×2 IMPLANT
SCREW MD LOCKING 1.5X6MM (Screw) ×4 IMPLANT
SCREW MULTI HEXALOBE 1.5X7 (Screw) ×2 IMPLANT
SCREW NLOCK 1.5X9 (Screw) ×2 IMPLANT
SCREW NONLOCKING 1.5X8MM (Screw) ×2 IMPLANT
SOAP 2 % CHG 4 OZ (WOUND CARE) ×2 IMPLANT
SPLINT FIBERGLASS 4X30 (CAST SUPPLIES) ×2 IMPLANT
SUCTION FRAZIER HANDLE 10FR (MISCELLANEOUS)
SUCTION TUBE FRAZIER 10FR DISP (MISCELLANEOUS) IMPLANT
SUT ETHIBOND 3-0 V-5 (SUTURE) ×2 IMPLANT
SUT MERSILENE 4 0 P 3 (SUTURE) ×2 IMPLANT
SUT PROLENE 4 0 PS 2 18 (SUTURE) ×2 IMPLANT
SYR BULB IRRIG 60ML STRL (SYRINGE) ×2 IMPLANT
SYR CONTROL 10ML LL (SYRINGE) ×2 IMPLANT
TACK PLATE ORTHO 40MM (WIRE) ×1 IMPLANT
TOWEL GREEN STERILE (TOWEL DISPOSABLE) ×2 IMPLANT
TOWEL GREEN STERILE FF (TOWEL DISPOSABLE) ×2 IMPLANT
TUBE CONNECTING 12X1/4 (SUCTIONS) ×2 IMPLANT
UNDERPAD 30X36 HEAVY ABSORB (UNDERPADS AND DIAPERS) ×2 IMPLANT
WATER STERILE IRR 1000ML POUR (IV SOLUTION) ×2 IMPLANT

## 2020-09-03 NOTE — Anesthesia Procedure Notes (Signed)
Procedure Name: LMA Insertion Date/Time: 09/03/2020 9:30 AM Performed by: Shary Decamp, CRNA Pre-anesthesia Checklist: Patient identified, Patient being monitored, Timeout performed, Emergency Drugs available and Suction available Patient Re-evaluated:Patient Re-evaluated prior to induction Oxygen Delivery Method: Circle System Utilized Preoxygenation: Pre-oxygenation with 100% oxygen Induction Type: IV induction and Inhalational induction Ventilation: Mask ventilation without difficulty LMA: LMA inserted LMA Size: 3.0 Number of attempts: 1 Placement Confirmation: positive ETCO2 and breath sounds checked- equal and bilateral Tube secured with: Tape Dental Injury: Teeth and Oropharynx as per pre-operative assessment

## 2020-09-03 NOTE — Discharge Instructions (Signed)
Discharge Instructions  - Keep dressings in place. Do not remove them. - The dressings must stay dry - Take all medication as prescribed. Transition to over the counter pain medication as your pain improves - Keep the hand elevated over the next 48-72 hours to help with pain and swelling - Move all digits not restricted by the dressings regularly to prevent stiffness - Please call to schedule a follow up appointment with Dr. Citlalic Norlander and therapy at [phone] for 5/7 days following surgery - Your pain medication have been sent digitally to your pharmacy  

## 2020-09-03 NOTE — H&P (Signed)
ORTHOPAEDIC H&P  PCP:  Wilford Corner, PA-C  Chief Complaint: Right middle finger pain  HPI: Samantha Newman is a 34 y.o. female who complains of right middle finger pain.  She was seen in clinic where she was found to have a displaced right middle finger proximal phalanx fracture.  She was initially treated at outside institution nonoperatively but had displacement of the fracture and was then subsequently seen by me in clinic.  She was nearly 9 weeks out from her injury and had no significant healing on her x-rays.  Additionally there was shortening and rotation of the fracture.  We discussed treatment options going forward and she did wish to proceed forward surgical fixation of her finger.  The patient is [redacted] weeks pregnant she does there is a risk involved with this and presents today for further treatment.  Past Medical History:  Diagnosis Date  . Depression    during high school  . Medical history non-contributory    Past Surgical History:  Procedure Laterality Date  . HYSTEROSCOPY N/A 05/04/2020   Procedure: HYSTEROSCOPY;  Surgeon: Schermerhorn, Ihor Austin, MD;  Location: [facility] ORS;  Service: Gynecology;  Laterality: N/A;  . IUD REMOVAL N/A 05/04/2020   Procedure: INTRAUTERINE DEVICE (IUD) REMOVAL;  Surgeon: Schermerhorn, Ihor Austin, MD;  Location: [facility] ORS;  Service: Gynecology;  Laterality: N/A;  . WISDOM TOOTH EXTRACTION     Social History   Socioeconomic History  . Marital status: Married    Spouse name: Not on file  . Number of children: Not on file  . Years of education: Not on file  . Highest education level: Not on file  Occupational History  . Not on file  Tobacco Use  . Smoking status: Never Smoker  . Smokeless tobacco: Never Used  Vaping Use  . Vaping Use: Never used  Substance and Sexual Activity  . Alcohol use: Not Currently    Comment: occ   . Drug use: Never  . Sexual activity: Not on file  Other Topics Concern  . Not on file  Social  History Narrative  . Not on file   Social Determinants of Health   Financial Resource Strain:   . Difficulty of Paying Living Expenses: Not on file  Food Insecurity:   . Worried About Programme researcher, broadcasting/film/video in the Last Year: Not on file  . Ran Out of Food in the Last Year: Not on file  Transportation Needs:   . Lack of Transportation (Medical): Not on file  . Lack of Transportation (Non-Medical): Not on file  Physical Activity:   . Days of Exercise per Week: Not on file  . Minutes of Exercise per Session: Not on file  Stress:   . Feeling of Stress : Not on file  Social Connections:   . Frequency of Communication with Friends and Family: Not on file  . Frequency of Social Gatherings with Friends and Family: Not on file  . Attends Religious Services: Not on file  . Active Member of Clubs or Organizations: Not on file  . Attends Banker Meetings: Not on file  . Marital Status: Not on file   History reviewed. No pertinent family history. No Known Allergies Prior to Admission medications   Medication Sig Start Date End Date Taking? Authorizing Provider  acyclovir (ZOVIRAX) 400 MG tablet Take 400 mg by mouth at bedtime.  04/09/20  Yes [provider]  Prenatal Vit-Fe Fumarate-FA (PRENATAL MULTIVITAMIN) TABS tablet Take 1 tablet by  mouth daily at 12 noon.   Yes [provider]   No results found.  Positive ROS: All other systems have been reviewed and were otherwise negative with the exception of those mentioned in the HPI and as above.  Physical Exam: General: Alert, no acute distress Cardiovascular: No edema Respiratory: No cyanosis, no use of accessory musculature Skin: No lesions in the area of chief complaint  Psychiatric: Patient is competent for consent with normal mood and affect  MUSCULOSKELETAL:  Examination of the right hand shows a mildly swollen middle finger. There is angulation of the digit towards the ring finger. She has tenderness  palpation through the proximal phalanx of the middle finger. No significant tenderness at the PIP, middle phalanx or distal phalanx. She has significant lack range of motion in terms of flexion to the middle finger and with flexion she has worsening of rotation of the digit towards the ring finger. No tenderness elsewhere in the hand.  Assessment: Displaced right middle finger proximal phalanx fracture  Plan: Proceed forward with open reduction and internal fixation of the right middle finger proximal phalanx  The risk, benefits and alternatives of the procedure were discussed with patient once again.  These risks include but are not limited to infection, bleeding, damage to surrounding structures including blood vessels and nerves, pain, stiffness, malunion, nonunion, implant failure need for additional procedures.  Informed consent was obtained the patient's right hand was marked.  Anesthesia planned was discussed between the patient and anesthesiologist.  We will defer decision regarding best anesthesia and the setting of the patient's first trimester pregnancy between the patient and anesthesiologist.  Plan for discharge home postoperatively with follow-up with me next week to begin early range of motion of the digit.   Ernest Mallick, MD [phone]   09/03/2020 8:49 AM

## 2020-09-03 NOTE — Telephone Encounter (Signed)
Patient has surgery today and was prescribed hydrocodone-acetaminophen 5-325 MG tablets today, patient is unsure if these are safe to take or not given her pregnancy.  Could you please advise?

## 2020-09-03 NOTE — Transfer of Care (Signed)
Immediate Anesthesia Transfer of Care Note  Patient: Samantha Newman  Procedure(s) Performed: Right middle finger proximal phalanx fracture open reduction and internal fixation (Right Finger)  Patient Location: PACU  Anesthesia Type:General  Level of Consciousness: drowsy, patient cooperative and responds to stimulation  Airway & Oxygen Therapy: Patient Spontanous Breathing and Patient connected to nasal cannula oxygen  Post-op Assessment: Report given to RN, Post -op Vital signs reviewed and stable and Patient moving all extremities X 4  Post vital signs: Reviewed and stable  Last Vitals:  Vitals Value Taken Time  BP 93/80 09/03/20 1101  Temp    Pulse 92 09/03/20 1103  Resp 11 09/03/20 1103  SpO2 100 % 09/03/20 1103  Vitals shown include unvalidated device data.  Last Pain:  Vitals:   09/03/20 0801  TempSrc: Oral  PainSc:          Complications: No complications documented.

## 2020-09-03 NOTE — Telephone Encounter (Signed)
Call transferred from front desk-   Pt had surgery on her finger today- 2 screws and a plate. She was given vicodin and wanted to make sure it was safe to take in pregnancy.   Confirmed with DJE Vicodin ok to take. PT voiced understanding. Safe med list sent via my chart.

## 2020-09-03 NOTE — Op Note (Signed)
PREOPERATIVE DIAGNOSIS: Displaced right middle finger proximal phalanx fracture  POSTOPERATIVE DIAGNOSIS: Same  ATTENDING PHYSICIAN: Gasper Lloyd. Roney Mans, III, MD who was present and scrubbed for the entire case   ASSISTANT SURGEON: None.   ANESTHESIA: General  SURGICAL PROCEDURES: Open reduction and internal fixation of right displaced middle finger proximal phalanx fracture  SURGICAL INDICATIONS: Patient is a 34 year old female who was seen by me in clinic last week.  Approximately 9 weeks ago she had an injury to the right middle finger in which her dog the finger with a leash.  She had immediate pain and swelling to the finger was seen at outside urgent care.  She was initially treated nonoperatively by an outside orthopedist but had continued pain, deformity and swelling to the finger.  She was then seen by me in clinic where she had minimal healing seen radiographically of her fracture as well as persistent angulation and deformity of the finger.  We discussed treatment options going forward and she did wish to proceed forward with an open reduction and internal fixation of the finger presents today for that.  FINDING: Near-anatomic alignment of the middle finger P1 fracture was achieved and stable fixation with a dorsal plate and screw construct.  DESCRIPTION OF PROCEDURE: Patient was identified in the preop holding area where the risk benefits and alternatives of procedure were discussed with the patient.  These risks include but are not limited to infection, bleeding, damage to surrounding structures including blood vessels and nerves, pain, stiffness, malunion, nonunion, implant failure and need for additional procedures.  Informed consent was obtained at that time the patient's right middle finger was marked with a surgical marking pen.  She was then brought back to the operative suite where timeout was performed identifying the correct patient operative site.  Patient was positioned supine  on the operative table and induced under general anesthesia.  A tourniquet is placed on the upper arm and the right upper extremity was then prepped and draped in usual sterile fashion.  Preoperative antibiotics were administered.  The right upper extremity was then exsanguinated and the tourniquet was inflated.  A dorsal approach to the middle finger was used with a longitudinal incision centered around the proximal phalanx.  Blunt dissection was carried down through subcutaneous tissues raising skin flaps.  The extensor mechanism was visualized and a 15 blade was used to create a longitudinal incision in the tendon directly overlying the proximal phalanx.  There was then visualization of the proximal phalanx with significant callus formation around the bone.  Manipulation of the finger was performed and the fracture was identified.  Was then manipulated and mobilized using a Therapist, nutritional as well as an osteotome.  Callus was removed using a rondure.  Once cortical bone was well visualized dorsally a clamp was placed to compress the fracture into near-anatomic alignment.  This was confirmed in both PA and lateral fluoroscopic images.  At this point an Acumed, 0.8 mm hand fracture plate was placed along the dorsal aspect of the bone.  A pin was placed through the plate to secure it into the appropriate position.  Multiple 1.5 millimeter screws were then placed both locking and nonlocking throughout the plate which secured the fracture in place.  This was confirmed under multiple fluoroscopic images.  At this point the wound was copiously irrigated with normal saline.  Close examination of the digit was then performed and found to have correction of the preoperative rotational deformity with this move cascade to the digits  and full range of motion of the middle finger.  The extensor mechanism was then closed with a running 3-0 Ethibond suture followed by closure of the skin with 4-0 Prolene sutures in interrupted  fashion.  Xeroform, 4 x 4's and well-padded ulnar gutter splint were placed.  The tourniquet was released and the patient had return of brisk capillary refill to all of her digits.  She was awoken from anesthesia and taken to the PACU in stable condition.  She tolerated her procedure well and there were no complications.  RADIOGRAPHIC INTERPRETATION: PA and lateral fluoroscopic images of the right middle finger were obtained intraoperatively.  These show near-anatomic alignment of the previously displaced middle phalanx fracture with dorsal plate and screw construct.  All screw lengths appear appropriate.  No new fractures are noted.  ESTIMATED BLOOD LOSS: 10 mL  TOURNIQUET TIME: Approximately 60 minutes  SPECIMENS: None  POSTOPERATIVE PLAN: The patient will be discharged home and seen back  in the office in approximately 1 week for wound check as well as referral to occupational therapy.  We will transition her to a hand-based radial gutter splint and early range of motion exercises to her middle finger to prevent adhesions  IMPLANTS: Acumed 0.8 mm hand T plate with 1.5 mm locking and nonlocking screws

## 2020-09-03 NOTE — Anesthesia Postprocedure Evaluation (Signed)
Anesthesia Post Note  Patient: Samantha Newman  Procedure(s) Performed: Right middle finger proximal phalanx fracture open reduction and internal fixation (Right Finger)     Patient location during evaluation: PACU Anesthesia Type: General Level of consciousness: sedated Pain management: pain level controlled Vital Signs Assessment: post-procedure vital signs reviewed and stable Respiratory status: spontaneous breathing and respiratory function stable Cardiovascular status: stable Postop Assessment: no apparent nausea or vomiting Anesthetic complications: no   No complications documented.  Last Vitals:  Vitals:   09/03/20 1134 09/03/20 1149  BP: 113/61 118/64  Pulse: 86 80  Resp: 14 12  Temp:    SpO2: 100% 99%    Last Pain:  Vitals:   09/03/20 1133  TempSrc:   PainSc: 7                  [name] DANIEL

## 2020-09-04 ENCOUNTER — Encounter (HOSPITAL_COMMUNITY): Payer: Self-pay | Admitting: Orthopaedic Surgery

## 2020-09-04 NOTE — Addendum Note (Signed)
Addendum  created 09/04/20 1340 by Heather Roberts, MD   Intraprocedure Staff edited

## 2020-09-11 ENCOUNTER — Other Ambulatory Visit: Payer: Self-pay

## 2020-09-11 ENCOUNTER — Ambulatory Visit (INDEPENDENT_AMBULATORY_CARE_PROVIDER_SITE_OTHER): Payer: BC Managed Care – PPO

## 2020-09-11 DIAGNOSIS — N926 Irregular menstruation, unspecified: Secondary | ICD-10-CM | POA: Diagnosis not present

## 2020-09-11 DIAGNOSIS — Z3A01 Less than 8 weeks gestation of pregnancy: Secondary | ICD-10-CM

## 2020-10-08 ENCOUNTER — Other Ambulatory Visit: Payer: Self-pay

## 2020-10-08 ENCOUNTER — Ambulatory Visit (INDEPENDENT_AMBULATORY_CARE_PROVIDER_SITE_OTHER): Payer: BC Managed Care – PPO | Admitting: Surgical

## 2020-10-08 VITALS — BP 140/94 | HR 104 | Ht 59.0 in | Wt 111.9 lb

## 2020-10-08 DIAGNOSIS — Z3491 Encounter for supervision of normal pregnancy, unspecified, first trimester: Secondary | ICD-10-CM

## 2020-10-08 NOTE — Progress Notes (Signed)
Samantha Newman presents for NOB nurse interview visit. Pregnancy confirmation done 08/28/2020. G1. P0. Pregnancy education material explained and given. 0 cats in home. NOB labs ordered. HIV labs and drug screen were explained and ordered. PNV encouraged. Genetic screening options discussed. Genetic testing:Done. Patient may discuss with the provider. Patient to follow up with provider on 10/25/2020 for NOB physical. All questions answered. Patient is still having some nausea and vomiting. She is taking B6 and unisom. Patient BP was elevated today. Patient stated that she sometimes has elevated BP when going to the doctor.

## 2020-10-09 LAB — CBC WITH DIFFERENTIAL/PLATELET
Basophils Absolute: 0 10*3/uL (ref 0.0–0.2)
Basos: 0 %
EOS (ABSOLUTE): 0 10*3/uL (ref 0.0–0.4)
Eos: 0 %
Hematocrit: 34.7 % (ref 34.0–46.6)
Hemoglobin: 11.8 g/dL (ref 11.1–15.9)
Immature Grans (Abs): 0 10*3/uL (ref 0.0–0.1)
Immature Granulocytes: 0 %
Lymphocytes Absolute: 1.9 10*3/uL (ref 0.7–3.1)
Lymphs: 21 %
MCH: 30.2 pg (ref 26.6–33.0)
MCHC: 34 g/dL (ref 31.5–35.7)
MCV: 89 fL (ref 79–97)
Monocytes Absolute: 0.7 10*3/uL (ref 0.1–0.9)
Monocytes: 8 %
Neutrophils Absolute: 6.5 10*3/uL (ref 1.4–7.0)
Neutrophils: 71 %
Platelets: 268 10*3/uL (ref 150–450)
RBC: 3.91 x10E6/uL (ref 3.77–5.28)
RDW: 12.2 % (ref 11.7–15.4)
WBC: 9.2 10*3/uL (ref 3.4–10.8)

## 2020-10-09 LAB — URINALYSIS, ROUTINE W REFLEX MICROSCOPIC
Bilirubin, UA: NEGATIVE
Glucose, UA: NEGATIVE
Ketones, UA: NEGATIVE
Leukocytes,UA: NEGATIVE
Nitrite, UA: POSITIVE — AB
Protein,UA: NEGATIVE
RBC, UA: NEGATIVE
Specific Gravity, UA: 1.016 (ref 1.005–1.030)
Urobilinogen, Ur: 0.2 mg/dL (ref 0.2–1.0)
pH, UA: 7.5 (ref 5.0–7.5)

## 2020-10-09 LAB — RPR: RPR Ser Ql: NONREACTIVE

## 2020-10-09 LAB — MICROSCOPIC EXAMINATION
Casts: NONE SEEN /lpf
Epithelial Cells (non renal): >10 /hpf — AB (ref 0–10)
RBC, Urine: NONE SEEN /hpf (ref 0–2)

## 2020-10-09 LAB — VIRAL HEPATITIS HBV, HCV
HCV Ab: <0.1 s/co ratio (ref 0.0–0.9)
Hep B Core Total Ab: NEGATIVE
Hep B Surface Ab, Qual: REACTIVE
Hepatitis B Surface Ag: NEGATIVE

## 2020-10-09 LAB — ABO AND RH: Rh Factor: POSITIVE

## 2020-10-09 LAB — ANTIBODY SCREEN: Antibody Screen: NEGATIVE

## 2020-10-09 LAB — HCV INTERPRETATION

## 2020-10-09 LAB — HIV ANTIBODY (ROUTINE TESTING W REFLEX): HIV Screen 4th Generation wRfx: NONREACTIVE

## 2020-10-09 LAB — VARICELLA ZOSTER ANTIBODY, IGG: Varicella zoster IgG: 1431 index (ref >165)

## 2020-10-09 LAB — RUBELLA SCREEN: Rubella Antibodies, IGG: 8.57 index (ref >0.99)

## 2020-10-10 LAB — MONITOR DRUG PROFILE 14(MW)
Amphetamine Scrn, Ur: NEGATIVE ng/mL
BARBITURATE SCREEN URINE: NEGATIVE ng/mL
BENZODIAZEPINE SCREEN, URINE: NEGATIVE ng/mL
Buprenorphine, Urine: NEGATIVE ng/mL
CANNABINOIDS UR QL SCN: NEGATIVE ng/mL
Cocaine (Metab) Scrn, Ur: NEGATIVE ng/mL
Creatinine(Crt), U: 103.2 mg/dL (ref 20.0–300.0)
Fentanyl, Urine: NEGATIVE pg/mL
Meperidine Screen, Urine: NEGATIVE ng/mL
Methadone Screen, Urine: NEGATIVE ng/mL
OXYCODONE+OXYMORPHONE UR QL SCN: NEGATIVE ng/mL
Opiate Scrn, Ur: NEGATIVE ng/mL
Ph of Urine: 7.7 (ref 4.5–8.9)
Phencyclidine Qn, Ur: NEGATIVE ng/mL
Propoxyphene Scrn, Ur: NEGATIVE ng/mL
SPECIFIC GRAVITY: 1.016
Tramadol Screen, Urine: NEGATIVE ng/mL

## 2020-10-10 LAB — NICOTINE SCREEN, URINE: Cotinine Ql Scrn, Ur: NEGATIVE ng/mL

## 2020-10-11 LAB — GC/CHLAMYDIA PROBE AMP
Chlamydia trachomatis, NAA: NEGATIVE
Neisseria Gonorrhoeae by PCR: NEGATIVE

## 2020-10-12 LAB — MATERNIT21  PLUS CORE+ESS+SCA, BLOOD

## 2020-10-12 LAB — URINE CULTURE, OB REFLEX

## 2020-10-12 LAB — CULTURE, OB URINE

## 2020-10-13 NOTE — L&D Delivery Note (Signed)
Delivery Summary for Samantha Newman  Labor Events:   Preterm labor: No data found  Rupture date: 05/08/2021  Rupture time: 8:50 AM  Rupture type: Spontaneous  Fluid Color: Clear Particulate Meconium  Induction: No data found  Augmentation: No data found  Complications: No data found  Cervical ripening: No data found No data found   No data found     Delivery:   Episiotomy: No data found  Lacerations: No data found  Repair suture: No data found  Repair # of packets: No data found  Blood loss (ml): 460   Information for the patient's newborn:  Daniya, Aramburo Girl [name] [[id number]]   Delivery 05/08/2021 10:42 PM by  Vaginal, Spontaneous Sex:  female Gestational Age: [redacted]w[redacted]d Delivery Clinician:   Living?:         APGARS  One minute Five minutes Ten minutes  Skin color:        Heart rate:        Grimace:        Muscle tone:        Breathing:        Totals: 6  9      Presentation/position:      Resuscitation:   Cord information:    Disposition of cord blood:     Blood gases sent?  Complications:   Placenta: Delivered:       appearance Newborn Measurements: Weight: 7 lb 3 oz (3260 g)  Height: 20"  Head circumference:    Chest circumference:    Other providers:    Additional  information: Forceps:   Vacuum:   Breech:   Observed anomalies No anomalies noted at delivery    Delivery Note At 10:42 PM a viable and healthy female was delivered via Vaginal, Spontaneous (Presentation: Vertex: LOA position).  APGAR: 6, 9; weight 7 lb 3 oz (3260 g).   Placenta status: Spontaneous, Intact.  Cord: 3 vessels with the following complications: None.  Cord pH: not obtained.   Anesthesia: Epidural Episiotomy: None Lacerations: 1st degree Suture Repair: 3.0 vicryl Est. Blood Loss (mL): 460  Mom to postpartum.  Baby to Couplet care / Skin to Skin.  Hildred Laser, MD 05/08/2021  11:22 P.M.

## 2020-10-16 ENCOUNTER — Other Ambulatory Visit: Payer: Self-pay | Admitting: Surgical

## 2020-10-16 MED ORDER — NITROFURANTOIN MONOHYD MACRO 100 MG PO CAPS: 100.0000 mg | ORAL_CAPSULE | Freq: Two times a day (BID) | ORAL | 0 refills | Status: DC

## 2020-10-25 ENCOUNTER — Other Ambulatory Visit: Payer: Self-pay

## 2020-10-25 ENCOUNTER — Ambulatory Visit (INDEPENDENT_AMBULATORY_CARE_PROVIDER_SITE_OTHER): Payer: BC Managed Care – PPO | Admitting: Obstetrics and Gynecology

## 2020-10-25 ENCOUNTER — Encounter: Payer: Self-pay | Admitting: Obstetrics and Gynecology

## 2020-10-25 VITALS — BP 134/83 | HR 118 | Wt 113.2 lb

## 2020-10-25 DIAGNOSIS — Z3A12 12 weeks gestation of pregnancy: Secondary | ICD-10-CM

## 2020-10-25 DIAGNOSIS — Z3491 Encounter for supervision of normal pregnancy, unspecified, first trimester: Secondary | ICD-10-CM

## 2020-10-25 NOTE — Progress Notes (Signed)
NOB: Patient doing well.  Still has occasional nausea and vomiting but this is improving.  Taking Macrobid for UTI as directed.  Advised patient to begin use of ASA 81 mg. aFP next visit Physical examination General NAD, Conversant  HEENT Atraumatic; Op clear with mmm.  Normo-cephalic. Pupils reactive. Anicteric sclerae  Thyroid/Neck Smooth without nodularity or enlargement. Normal ROM.  Neck Supple.  Skin No rashes, lesions or ulceration. Normal palpated skin turgor. No nodularity.  Breasts: No masses or discharge.  Symmetric.  No axillary adenopathy.  Lungs: Clear to auscultation.No rales or wheezes. Normal Respiratory effort, no retractions.  Heart: NSR.  No murmurs or rubs appreciated. No periferal edema  Abdomen: Soft.  Non-tender.  No masses.  No HSM. No hernia  Extremities: Moves all appropriately.  Normal ROM for age. No lymphadenopathy.  Neuro: Oriented to PPT.  Normal mood. Normal affect.     Pelvic:   Vulva: Normal appearance.  No lesions.  Vagina: No lesions or abnormalities noted.  Support: Normal pelvic support.  Urethra No masses tenderness or scarring.  Meatus Normal size without lesions or prolapse.  Cervix: Normal appearance.  No lesions.  Anus: Normal exam.  No lesions.  Perineum: Normal exam.  No lesions.        Bimanual   Adnexae: No masses.  Non-tender to palpation.  Uterus: Enlarged. 13wks 158BPM  Non-tender.  Mobile.  AV.  Adnexae: No masses.  Non-tender to palpation.  Cul-de-sac: Negative for abnormality.  Adnexae: No masses.  Non-tender to palpation.         Pelvimetry   Diagonal: Reached.  Spines: Average.  Sacrum: Concave.  Pubic Arch: Normal.

## 2020-11-27 ENCOUNTER — Encounter: Payer: BC Managed Care – PPO | Admitting: Obstetrics and Gynecology

## 2020-11-27 NOTE — Patient Instructions (Signed)
WHAT OB PATIENTS CAN EXPECT   Confirmation of pregnancy and ultrasound ordered if medically indicated-[redacted] weeks gestation  New OB (NOB) intake with nurse and New OB (NOB) labs- [redacted] weeks gestation  New OB (NOB) physical examination with provider- 11/[redacted] weeks gestation  Flu vaccine-[redacted] weeks gestation  Anatomy scan-[redacted] weeks gestation  Glucose tolerance test, blood work to test for anemia, T-dap vaccine-[redacted] weeks gestation  Vaginal swabs/cultures-STD/Group B strep-[redacted] weeks gestation  Appointments every 4 weeks until 28 weeks  Every 2 weeks from 28 weeks until 36 weeks  Weekly visits from 36 weeks until delivery  Second Trimester of Pregnancy  The second trimester of pregnancy is from week 13 through week 27. This is also called months 4 through 6 of pregnancy. This is often the time when you feel your best. During the second trimester:  Morning sickness is less or has stopped.  You may have more energy.  You may feel hungry more often. At this time, your unborn baby (fetus) is growing very fast. At the end of the sixth month, the unborn baby may be up to 12 inches long and weigh about 1 pounds. You will likely start to feel the baby move between 16 and 20 weeks of pregnancy. Body changes during your second trimester Your body continues to go through many changes during this time. The changes vary and generally return to normal after the baby is born. Physical changes  You will gain more weight.  You may start to get stretch marks on your hips, belly (abdomen), and breasts.  Your breasts will grow and may hurt.  Dark spots or blotches may develop on your face.  A dark line from your belly button to the pubic area (linea nigra) may appear.  You may have changes in your hair. Health changes  You may have headaches.  You may have heartburn.  You may have trouble pooping (constipation).  You may have hemorrhoids or swollen, bulging veins (varicose veins).  Your gums may  bleed.  You may pee (urinate) more often.  You may have back pain. Follow these instructions at home: Medicines  Take over-the-counter and prescription medicines only as told by your doctor. Some medicines are not safe during pregnancy.  Take a prenatal vitamin that contains at least 600 micrograms (mcg) of folic acid. Eating and drinking  Eat healthy meals that include: ? Fresh fruits and vegetables. ? Whole grains. ? Good sources of protein, such as meat, eggs, or tofu. ? Low-fat dairy products.  Avoid raw meat and unpasteurized juice, milk, and cheese.  You may need to take these actions to prevent or treat trouble pooping: ? Drink enough fluids to keep your pee (urine) pale yellow. ? Eat foods that are high in fiber. These include beans, whole grains, and fresh fruits and vegetables. ? Limit foods that are high in fat and sugar. These include fried or sweet foods. Activity  Exercise only as told by your doctor. Most people can do their usual exercise during pregnancy. Try to exercise for 30 minutes at least 5 days a week.  Stop exercising if you have pain or cramps in your belly or lower back.  Do not exercise if it is too hot or too humid, or if you are in a place of great height (high altitude).  Avoid heavy lifting.  If you choose to, you may have sex unless your doctor tells you not to. Relieving pain and discomfort  Wear a good support bra if your breasts   are sore.  Take warm water baths (sitz baths) to soothe pain or discomfort caused by hemorrhoids. Use hemorrhoid cream if your doctor approves.  Rest with your legs raised (elevated) if you have leg cramps or low back pain.  If you develop bulging veins in your legs: ? Wear support hose as told by your doctor. ? Raise your feet for 15 minutes, 3-4 times a day. ? Limit salt in your food. Safety  Wear your seat belt at all times when you are in a car.  Talk with your doctor if someone is hurting you or  yelling at you a lot. Lifestyle  Do not use hot tubs, steam rooms, or saunas.  Do not douche. Do not use tampons or scented sanitary pads.  Avoid cat litter boxes and soil used by cats. These carry germs that can harm your baby and can cause a loss of your baby by miscarriage or stillbirth.  Do not use herbal medicines, illegal drugs, or medicines that are not approved by your doctor. Do not drink alcohol.  Do not smoke or use any products that contain nicotine or tobacco. If you need help quitting, ask your doctor. General instructions  Keep all follow-up visits. This is important.  Ask your doctor about local prenatal classes.  Ask your doctor about the right foods to eat or for help finding a counselor. Where to find more information  American Pregnancy Association: americanpregnancy.org  American College of Obstetricians and Gynecologists: www.acog.org  Office on Women's Health: womenshealth.gov/pregnancy Contact a doctor if:  You have a headache that does not go away when you take medicine.  You have changes in how you see, or you see spots in front of your eyes.  You have mild cramps, pressure, or pain in your lower belly.  You continue to feel like you may vomit (nauseous), you vomit, or you have watery poop (diarrhea).  You have bad-smelling fluid coming from your vagina.  You have pain when you pee or your pee smells bad.  You have very bad swelling of your face, hands, ankles, feet, or legs.  You have a fever. Get help right away if:  You are leaking fluid from your vagina.  You have spotting or bleeding from your vagina.  You have very bad belly cramping or pain.  You have trouble breathing.  You have chest pain.  You faint.  You have not felt your baby move for the time period told by your doctor.  You have new or increased pain, swelling, or redness in an arm or leg. Summary  The second trimester of pregnancy is from week 13 through week 27  (months 4 through 6).  Eat healthy meals.  Exercise as told by your doctor. Most people can do their usual exercise during pregnancy.  Do not use herbal medicines, illegal drugs, or medicines that are not approved by your doctor. Do not drink alcohol.  Call your doctor if you get sick or if you notice anything unusual about your pregnancy. This information is not intended to replace advice given to you by your health care provider. Make sure you discuss any questions you have with your health care provider. Document Revised: 03/07/2020 Document Reviewed: 01/12/2020 [employer]vier Patient Education  2021 [employer] Common Medications Safe in Pregnancy  Acne:      Constipation:  Benzoyl Peroxide     Colace  Clindamycin      Dulcolax Suppository  Topica Erythromycin     Fibercon  Salicylic Acid        Metamucil         Miralax AVOID:        Senakot   Accutane    Cough:  Retin-A       Cough Drops  Tetracycline      Phenergan w/ Codeine if Rx  Minocycline      Robitussin (Plain & DM)  Antibiotics:     Crabs/Lice:  Ceclor       RID  Cephalosporins    AVOID:  E-Mycins      Kwell  Keflex  Macrobid/Macrodantin   Diarrhea:  Penicillin      Kao-Pectate  Zithromax      Imodium AD         PUSH FLUIDS AVOID:       Cipro     Fever:  Tetracycline      Tylenol (Regular or Extra  Minocycline       Strength)  Levaquin      Extra Strength-Do not          Exceed 8 tabs/24 hrs Caffeine:        <200mg/day (equiv. To 1 cup of coffee or  approx. 3 12 oz sodas)         Gas: Cold/Hayfever:       Gas-X  Benadryl      Mylicon  Claritin       Phazyme  **Claritin-D        Chlor-Trimeton    Headaches:  Dimetapp      ASA-Free Excedrin  Drixoral-Non-Drowsy     Cold Compress  Mucinex (Guaifenasin)     Tylenol (Regular or Extra  Sudafed/Sudafed-12 Hour     Strength)  **Sudafed PE Pseudoephedrine   Tylenol Cold & Sinus     Vicks Vapor Rub  Zyrtec  **AVOID if Problems With Blood  Pressure         Heartburn: Avoid lying down for at least 1 hour after meals  Aciphex      Maalox     Rash:  Milk of Magnesia     Benadryl    Mylanta       1% Hydrocortisone Cream  Pepcid  Pepcid Complete   Sleep Aids:  Prevacid      Ambien   Prilosec       Benadryl  Rolaids       Chamomile Tea  Tums (Limit 4/day)     Unisom         Tylenol PM         Warm milk-add vanilla or  Hemorrhoids:       Sugar for taste  Anusol/Anusol H.C.  (RX: Analapram 2.5%)  Sugar Substitutes:  Hydrocortisone OTC     Ok in moderation  Preparation H      Tucks        Vaseline lotion applied to tissue with wiping    Herpes:     Throat:  Acyclovir      Oragel  Famvir  Valtrex     Vaccines:         Flu Shot Leg Cramps:       *Gardasil  Benadryl      Hepatitis A         Hepatitis B Nasal Spray:       Pneumovax  Saline Nasal Spray     Polio Booster         Tetanus Nausea:       Tuberculosis test or PPD  Vitamin B6 25 mg TID   AVOID:      Dramamine      *Gardasil  Emetrol       Live Poliovirus  Ginger Root 250 mg QID    MMR (measles, mumps &  High Complex Carbs @ Bedtime    rebella)  Sea Bands-Accupressure    Varicella (Chickenpox)  Unisom 1/2 tab TID     *No known complications           If received before Pain:         Known pregnancy;   Darvocet       Resume series after  Lortab        Delivery  Percocet    Yeast:   Tramadol      Femstat  Tylenol 3      Gyne-lotrimin  Ultram       Monistat  Vicodin           MISC:         All Sunscreens           Hair Coloring/highlights          Insect Repellant's          (Including DEET)         Mystic Tans

## 2020-11-28 ENCOUNTER — Ambulatory Visit (INDEPENDENT_AMBULATORY_CARE_PROVIDER_SITE_OTHER): Payer: BC Managed Care – PPO | Admitting: Obstetrics and Gynecology

## 2020-11-28 ENCOUNTER — Encounter: Payer: Self-pay | Admitting: Obstetrics and Gynecology

## 2020-11-28 ENCOUNTER — Other Ambulatory Visit: Payer: Self-pay

## 2020-11-28 VITALS — BP 126/84 | HR 92 | Wt 117.2 lb

## 2020-11-28 DIAGNOSIS — Z87440 Personal history of urinary (tract) infections: Secondary | ICD-10-CM

## 2020-11-28 DIAGNOSIS — Z3402 Encounter for supervision of normal first pregnancy, second trimester: Secondary | ICD-10-CM

## 2020-11-28 DIAGNOSIS — Z1379 Encounter for other screening for genetic and chromosomal anomalies: Secondary | ICD-10-CM

## 2020-11-28 DIAGNOSIS — Z3A17 17 weeks gestation of pregnancy: Secondary | ICD-10-CM

## 2020-11-28 LAB — POCT URINALYSIS DIPSTICK OB
Bilirubin, UA: NEGATIVE
Blood, UA: NEGATIVE
Glucose, UA: NEGATIVE
Ketones, UA: NEGATIVE
Leukocytes, UA: NEGATIVE
Nitrite, UA: NEGATIVE
POC,PROTEIN,UA: NEGATIVE
Spec Grav, UA: 1.01 (ref 1.010–1.025)
Urobilinogen, UA: 0.2 E.U./dL
pH, UA: 7 (ref 5.0–8.0)

## 2020-11-28 NOTE — Progress Notes (Signed)
ROB: Notes she still has some nausea and vomiting.  Doing better with Doxylamine and B6. Also noting food aversions due to heightened sense of smell. Discussed home measures. H/o UTI in first trimester, treated. UCx ordered.  Patient notes having history of UTI's in the past. Will continue to monitor in pregnancy. RTC in 3 weeks for ROB and anatomy scan.

## 2020-11-28 NOTE — Progress Notes (Signed)
OB-Pt present for routine prenatal care. Pt stated that she was doing well. Pt declined flu vaccine and unsure about getting the covid vaccine at this time.

## 2020-11-30 LAB — CULTURE, OB URINE

## 2020-11-30 LAB — URINE CULTURE, OB REFLEX

## 2020-12-06 LAB — AFP, SERUM, OPEN SPINA BIFIDA
AFP MoM: 1.6
AFP Value: 80.1 ng/mL
Gest. Age on Collection Date: 17.7 weeks
Maternal Age At EDD: 35 yr
OSBR Risk 1 IN: 2100
Test Results:: NEGATIVE
Weight: 117 [lb_av]

## 2020-12-20 ENCOUNTER — Other Ambulatory Visit: Payer: Self-pay

## 2020-12-20 ENCOUNTER — Ambulatory Visit (INDEPENDENT_AMBULATORY_CARE_PROVIDER_SITE_OTHER): Payer: BC Managed Care – PPO | Admitting: Obstetrics and Gynecology

## 2020-12-20 ENCOUNTER — Encounter: Payer: Self-pay | Admitting: Obstetrics and Gynecology

## 2020-12-20 ENCOUNTER — Other Ambulatory Visit: Payer: BC Managed Care – PPO

## 2020-12-20 VITALS — BP 119/88 | HR 97 | Wt 122.4 lb

## 2020-12-20 DIAGNOSIS — Z3402 Encounter for supervision of normal first pregnancy, second trimester: Secondary | ICD-10-CM

## 2020-12-20 DIAGNOSIS — Z3A20 20 weeks gestation of pregnancy: Secondary | ICD-10-CM

## 2020-12-20 LAB — POCT URINALYSIS DIPSTICK OB
Bilirubin, UA: NEGATIVE
Blood, UA: NEGATIVE
Glucose, UA: NEGATIVE
Ketones, UA: NEGATIVE
Leukocytes, UA: NEGATIVE
Nitrite, UA: NEGATIVE
POC,PROTEIN,UA: NEGATIVE
Spec Grav, UA: 1.01 (ref 1.010–1.025)
Urobilinogen, UA: 0.2 E.U./dL
pH, UA: 6.5 (ref 5.0–8.0)

## 2020-12-20 NOTE — Progress Notes (Signed)
ROB: Nausea and vomiting improved with doxylamine and B6.  Patient taking ASA as directed.  FAS in 10 days.

## 2020-12-31 ENCOUNTER — Other Ambulatory Visit: Payer: Self-pay

## 2020-12-31 ENCOUNTER — Ambulatory Visit
Admission: RE | Admit: 2020-12-31 | Discharge: 2020-12-31 | Disposition: A | Discharge status: Home / Self Care | Payer: BC Managed Care – PPO | Source: Ambulatory Visit | Attending: Obstetrics and Gynecology | Admitting: Obstetrics and Gynecology

## 2020-12-31 ENCOUNTER — Ambulatory Visit: Payer: BC Managed Care – PPO

## 2020-12-31 DIAGNOSIS — Z3402 Encounter for supervision of normal first pregnancy, second trimester: Secondary | ICD-10-CM | POA: Diagnosis not present

## 2021-01-17 NOTE — Patient Instructions (Addendum)
1-Hour Glucose  NO dessert the night before NO sweet drinks the day of- soda, fruit juice, sweet tea NO sweet breakfast foods-pancakes, donuts May have mostly protein-eggs, bacon, wheat toast, black coffee, grilled chicken, salad, vegetables, water    Pregnancy and COVID-19 Pregnant women and women who were recently pregnant are at an increased risk for severe illness from COVID-19. Other conditions, such as being pregnant at an older age or having diabetes or obesity, can further increase the risk of severe illness from COVID-19. This risk can last for at least 42 days following the end of the pregnancy. Protect yourself and your baby by:  Knowing your risk factors. Ask your health care provider about your specific risk factors.  Working with your health care team to protect yourself against all infections, including COVID-19. How does COVID-19 affect me? If you get COVID-19 while pregnant or shortly after your pregnancy, there is an increased risk that you may:  Get a respiratory illness that can lead to pneumonia or severe illness.  Give birth to your baby before 37 weeks of pregnancy (preterm birth).  Have other complications that can affect your pregnancy. How does COVID-19 affect my care? If you have COVID-19, special precautions will be taken around your pregnancy:  You will have to notify the clinic or hospital before a visit. Steps will be taken to protect other people from the virus, including seeing you in a special room.  Tests and scans may be done differently before delivery (prenatal care).  Your birth plan may change, including what room you will be in and who may be with you during labor and delivery.  You may stay longer in the hospital after delivery (postpartum care).  COVID-19 will affect where your baby will stay after delivery. Ask about the risks and benefits of staying in the same room with your baby. Benefits include breastfeeding and mother-newborn  bonding.  You may have to feed your baby differently.  Visitors will be limited after your baby is born. How does COVID-19 affect my baby? It is very rare for a mother with COVID-19 to pass the virus to the unborn baby. After birth, a baby can get the virus if he or she is exposed to it. Ask your health care provider about ways to protect your baby. The baby can be placed in an incubator. A physical barrier can also be used. What can I do to lower my risk? Medicines and vaccines  You can receive a COVID-19 vaccination. This can protect you from severe illness. If you have concerns, talk to your health care provider.  Get other recommended vaccines, including the flu vaccine and the whooping cough (Tdap) vaccine.  Ask your health care provider if you can get a 30-day, or longer, supply of your medicines, so you can make fewer trips to the pharmacy.  If you have received a COVID-19 vaccine, consider enrolling in the v-safe program from the CDC. This program uses an app on your smartphone to provide check-ins and gather information on your health after you receive the vaccine. There is a separate registry for pregnant women. For more information, visit: ? V-safe tool: http://boyd.org/ ? V-safe pregnancy registry: SuperiorMarketers.be Cleaning and personal hygiene If you are in isolation for COVID-19 and are sharing a room with your newborn, take these steps to reduce the risk of spreading the virus to your newborn:  Wash your hands with soap and water for at least 20 seconds before holding or caring for your baby.  If soap and water are not available, use alcohol-based hand sanitizer.  Wear a mask when within 6 feet (1.8 m) of your baby.  Keep your baby more than 6 feet (1.8 m) away from you as much as possible.  Avoid touching your mouth, face, eyes, or nose before washing your hands.  Clean and disinfect objects and surfaces that are frequently touched.   Other  things to do  Avoid people who might have been exposed to or infected with COVID-19, including people who live with you.  Cover your mouth and nose by wearing a mask or other cloth covering over your face when you go out in public.  Avoid people who are not wearing a mask.  Avoid large crowds. Maintain at least 6 feet (1.8 m) between yourself and others.  Avoid poorly ventilated spaces.  Call your health care provider if you have any health concerns. ? Contact your health care provider right away if you think you have COVID-19. Tell your health care provider that you think you may have a COVID-19 infection and that you are pregnant. Breastfeeding tips Plan with your family and health care team how to feed your baby. Current research shows that the virus may not pass to a baby through breast milk. If you are breastfeeding, you can receive a COVID-19 vaccine. The vaccines pose no risk for breastfeeding mothers or their babies.  Some of the vaccines might create antibodies in breast milk. These antibodies can help to protect your baby. Take precautions if you have or may have COVID-19. Precautions include:  Washing hands with soap and water for at least 20 seconds before feeding your baby. If soap and water are not available, use alcohol-based hand sanitizer.  Wearing a mask while feeding your baby.  Pumping or expressing breast milk to feed to your baby. If possible, ask someone in your household who is not sick to feed your baby the expressed breast milk. ? Wash your hands with soap and water for at least 20 seconds before touching pump parts. ? Wash and disinfect all pump parts after expressing milk. Follow the manufacturer's instructions to clean and disinfect all pump parts. Follow these instructions: Managing stress Some pregnant and postpartum women may have fear, uncertainty, and stress because of COVID-19. Find ways to manage stress. These may include:  Using relaxation  techniques such as meditation and deep breathing.  Getting regular exercise. Most women can continue their usual exercise routine during pregnancy. Ask your health care provider what activities are safe for you.  Seeking support from family, friends, or spiritual resources. If you cannot be together in person, you can still connect by phone calls, texts, video calls, or online messaging.  Doing relaxing activities that you enjoy, such as listening to music or reading a good book. General instructions  Follow your health care provider's instructions on taking medicines. Some medicines may not be safe to take during pregnancy.  Ask for help if you have counseling or nutritional needs. Your health care provider can offer advice or refer you to resources or specialists who can help you with various needs.  Keep all follow-up visits. This is important. This includes visits before and after you have your baby. Questions to ask your health care team  What should I do if I have COVID-19 symptoms?  What are the side effects that can occur after receiving any of the available COVID-19 vaccines?  How will COVID-19 affect my prenatal care visits, tests and scans, labor  and delivery, and postpartum care?  What are the risks of COVID-19 to me and the potential risks to my unborn baby or infant?  How do vaccines pass antibodies to my unborn baby?  Should I plan to breastfeed my baby?  Where can I find mental health resources?  Where can I find support if I have financial concerns? Where to find more information  CDC: PopCorners.nl  World Health Organization [facility]): HandicappingSports.nl  [employer] (ACOG): www.acog.org Contact a health care provider if:  You have signs and symptoms of infection, including a fever or cough. ? Tell your health care team that you think you may have a COVID-19 infection and that you are  pregnant.  You have strong emotions, such as sadness or anxiety.  You feel unsafe in your home and need help finding a safe place to live.  You have bloody or watery vaginal discharge or vaginal bleeding. Get help right away if:  You have signs or symptoms of labor before 37 weeks of pregnancy. These include: ? Contractions that are 5 minutes or less apart, or that increase in frequency, intensity, or length. ? Sudden, sharp pain in the abdomen or in the lower back. ? A gush or trickle of fluid from your vagina.  You have signs of more serious illness, such as: ? Trouble breathing. ? Chest pain. ? A fever of 102.84F (39C) or higher that does not go away. ? Vomiting every time you drink fluids. ? Feeling extremely weak. ? Fainting. These symptoms may represent a serious problem that is an emergency. Do not wait to see if the symptoms will go away. Get medical help right away. Call your local emergency services (911 in the [place] Do not drive yourself to the hospital. Summary  Pregnant women and women who were recently pregnant are at an increased risk for severe illness from COVID-19.  Take precautions to protect yourself and your baby. Wear a mask. Wash hands often. Avoid touching your mouth, face, eyes, or nose before washing hands. Avoid large groups of people and stay away from people who are sick.  If you think you have a COVID-19 infection, contact your health care provider right away. Tell your health care provider that you think you have COVID-19 and that you are pregnant.  If you have COVID-19, special precautions may be taken during pregnancy, labor and delivery, and after delivery. This information is not intended to replace advice given to you by your health care provider. Make sure you discuss any questions you have with your health care provider. Document Revised: 08/13/2020 Document Reviewed: 08/13/2020 Elsevier Patient Education  [place].   Common  Medications Safe in Pregnancy  Acne:      Constipation:  Benzoyl Peroxide     Colace  Clindamycin      Dulcolax Suppository  Topica Erythromycin     Fibercon  Salicylic Acid      Metamucil         Miralax AVOID:        Senakot   Accutane    Cough:  Retin-A       Cough Drops  Tetracycline      Phenergan w/ Codeine if Rx  Minocycline      Robitussin (Plain & DM)  Antibiotics:     Crabs/Lice:  Ceclor       RID  Cephalosporins    AVOID:  E-Mycins      Kwell  Keflex  Macrobid/Macrodantin  Diarrhea:  Penicillin      Kao-Pectate  Zithromax      Imodium AD         PUSH FLUIDS AVOID:       Cipro     Fever:  Tetracycline      Tylenol (Regular or Extra  Minocycline       Strength)  Levaquin      Extra Strength-Do not          Exceed 8 tabs/24 hrs Caffeine:        <287m/day (equiv. To 1 cup of coffee or  approx. 3 12 oz sodas)         Gas: Cold/Hayfever:       Gas-X  Benadryl      Mylicon  Claritin       Phazyme  **Claritin-D        Chlor-Trimeton    Headaches:  Dimetapp      ASA-Free Excedrin  Drixoral-Non-Drowsy     Cold Compress  Mucinex (Guaifenasin)     Tylenol (Regular or Extra  Sudafed/Sudafed-12 Hour     Strength)  **Sudafed PE Pseudoephedrine   Tylenol Cold & Sinus     Vicks Vapor Rub  Zyrtec  **AVOID if Problems With Blood Pressure         Heartburn: Avoid lying down for at least 1 hour after meals  Aciphex      Maalox     Rash:  Milk of Magnesia     Benadryl    Mylanta       1% Hydrocortisone Cream  Pepcid  Pepcid Complete   Sleep Aids:  Prevacid      Ambien   Prilosec       Benadryl  Rolaids       Chamomile Tea  Tums (Limit 4/day)     Unisom         Tylenol PM         Warm milk-add vanilla or  Hemorrhoids:       Sugar for taste  Anusol/Anusol H.C.  (RX: Analapram 2.5%)  Sugar Substitutes:  Hydrocortisone OTC     Ok in moderation  Preparation H      Tucks        Vaseline lotion applied to tissue with  wiping    Herpes:     Throat:  Acyclovir      Oragel  Famvir  Valtrex     Vaccines:         Flu Shot Leg Cramps:       *Gardasil  Benadryl      Hepatitis A         Hepatitis B Nasal Spray:       Pneumovax  Saline Nasal Spray     Polio Booster         Tetanus Nausea:       Tuberculosis test or PPD  Vitamin B6 25 mg TID   AVOID:    Dramamine      *Gardasil  Emetrol       Live Poliovirus  Ginger Root 250 mg QID    MMR (measles, mumps &  High Complex Carbs @ Bedtime    rebella)  Sea Bands-Accupressure    Varicella (Chickenpox)  Unisom 1/2 tab TID     *No known complications           If received before Pain:         Known pregnancy;   Darvocet  Resume series after  Lortab        Delivery  Percocet    Yeast:   Tramadol      Femstat  Tylenol 3      Gyne-lotrimin  Ultram       Monistat  Vicodin           MISC:         All Sunscreens           Hair Coloring/highlights          Insect Repellant's          (Including DEET)         Mystic Tans  Tdap (Tetanus, Diphtheria, Pertussis) Vaccine: What You Need to Know 1. Why get vaccinated? Tdap vaccine can prevent tetanus, diphtheria, and pertussis. Diphtheria and pertussis spread from person to person. Tetanus enters the body through cuts or wounds.  TETANUS (T) causes painful stiffening of the muscles. Tetanus can lead to serious health problems, including being unable to open the mouth, having trouble swallowing and breathing, or death.  DIPHTHERIA (D) can lead to difficulty breathing, heart failure, paralysis, or death.  PERTUSSIS (aP), also known as "whooping cough," can cause uncontrollable, violent coughing that makes it hard to breathe, eat, or drink. Pertussis can be extremely serious especially in babies and young children, causing pneumonia, convulsions, brain damage, or death. In teens and adults, it can cause weight loss, loss of bladder control, passing out, and rib fractures from severe coughing. 2. Tdap  vaccine Tdap is only for children 7 years and older, adolescents, and adults.  Adolescents should receive a single dose of Tdap, preferably at age 53 or 77 years. Pregnant people should get a dose of Tdap during every pregnancy, preferably during the early part of the third trimester, to help protect the newborn from pertussis. Infants are most at risk for severe, life-threatening complications from pertussis. Adults who have never received Tdap should get a dose of Tdap. Also, adults should receive a booster dose of either Tdap or Td (a different vaccine that protects against tetanus and diphtheria but not pertussis) every 10 years, or after 5 years in the case of a severe or dirty wound or burn. Tdap may be given at the same time as other vaccines. 3. Talk with your health care provider Tell your vaccine provider if the person getting the vaccine:  Has had an allergic reaction after a previous dose of any vaccine that protects against tetanus, diphtheria, or pertussis, or has any severe, life-threatening allergies  Has had a coma, decreased level of consciousness, or prolonged seizures within 7 days after a previous dose of any pertussis vaccine (DTP, DTaP, or Tdap)  Has seizures or another nervous system problem  Has ever had Guillain-Barr Syndrome (also called "GBS")  Has had severe pain or swelling after a previous dose of any vaccine that protects against tetanus or diphtheria In some cases, your health care provider may decide to postpone Tdap vaccination until a future visit. People with minor illnesses, such as a cold, may be vaccinated. People who are moderately or severely ill should usually wait until they recover before getting Tdap vaccine.  Your health care provider can give you more information. 4. Risks of a vaccine reaction  Pain, redness, or swelling where the shot was given, mild fever, headache, feeling tired, and nausea, vomiting, diarrhea, or stomachache sometimes  happen after Tdap vaccination. People sometimes faint after medical procedures, including vaccination. Tell your provider if you feel  dizzy or have vision changes or ringing in the ears.  As with any medicine, there is a very remote chance of a vaccine causing a severe allergic reaction, other serious injury, or death. 5. What if there is a serious problem? An allergic reaction could occur after the vaccinated person leaves the clinic. If you see signs of a severe allergic reaction (hives, swelling of the face and throat, difficulty breathing, a fast heartbeat, dizziness, or weakness), call 9-1-1 and get the person to the nearest hospital. For other signs that concern you, call your health care provider.  Adverse reactions should be reported to the Vaccine Adverse Event Reporting System (VAERS). Your health care provider will usually file this report, or you can do it yourself. Visit the VAERS website at www.vaers.SamedayNews.es or call 406 428 1681. VAERS is only for reporting reactions, and VAERS staff members do not give medical advice. 6. The National Vaccine Injury Compensation Program The [employer] Vaccine Injury Compensation Program (VICP) is a federal program that was created to compensate people who may have been injured by certain vaccines. Claims regarding alleged injury or death due to vaccination have a time limit for filing, which may be as short as two years. Visit the VICP website at GoldCloset.com.ee or call 802-820-0870 to learn about the program and about filing a claim. 7. How can I learn more?  Ask your health care provider.  Call your local or state health department.  Visit the website of the Food and Drug Administration (FDA) for vaccine package inserts and additional information at TraderRating.uy.  Contact the Centers for Disease Control and Prevention (CDC): ? Call 914-438-1159 (1-800-CDC-INFO) or ? Visit CDC's website at  http://hunter.com/. Vaccine Information Statement Tdap (Tetanus, Diphtheria, Pertussis) Vaccine (05/18/2020) This information is not intended to replace advice given to you by your health care provider. Make sure you discuss any questions you have with your health care provider. Document Revised: 06/13/2020 Document Reviewed: 06/13/2020 Elsevier Patient Education  2021 [employer].

## 2021-01-17 NOTE — Progress Notes (Signed)
ROB-PT present for routine prenatal care. Pt is aware of what her next visit will include, 1 hour glucose directions and information about Tdap vaccine given information.

## 2021-01-18 ENCOUNTER — Other Ambulatory Visit: Payer: Self-pay

## 2021-01-18 ENCOUNTER — Encounter: Payer: Self-pay | Admitting: Obstetrics and Gynecology

## 2021-01-18 ENCOUNTER — Ambulatory Visit (INDEPENDENT_AMBULATORY_CARE_PROVIDER_SITE_OTHER): Payer: BC Managed Care – PPO | Admitting: Obstetrics and Gynecology

## 2021-01-18 VITALS — BP 117/81 | HR 96 | Ht 59.0 in | Wt 126.9 lb

## 2021-01-18 DIAGNOSIS — Z3A34 34 weeks gestation of pregnancy: Secondary | ICD-10-CM

## 2021-01-18 DIAGNOSIS — Z3402 Encounter for supervision of normal first pregnancy, second trimester: Secondary | ICD-10-CM

## 2021-01-18 DIAGNOSIS — Z7185 Encounter for immunization safety counseling: Secondary | ICD-10-CM

## 2021-01-18 LAB — POCT URINALYSIS DIPSTICK OB
Bilirubin, UA: NEGATIVE
Blood, UA: NEGATIVE
Glucose, UA: NEGATIVE
Ketones, UA: NEGATIVE
Leukocytes, UA: NEGATIVE
Nitrite, UA: NEGATIVE
POC,PROTEIN,UA: NEGATIVE
Spec Grav, UA: 1.01 (ref 1.010–1.025)
Urobilinogen, UA: 0.2 E.U./dL
pH, UA: 6.5 (ref 5.0–8.0)

## 2021-01-18 NOTE — Progress Notes (Signed)
ROB: Has questions about sleep positions in pregnancy. Also notes that her job is going to start having people return to office but desires to continue to work from home. Requests letter for job. Also discussed COVID vaccination.  Reviewed anatomy scan, normal. RTC in 4 weeks, for 28 week labs then.

## 2021-02-15 ENCOUNTER — Encounter: Payer: Self-pay | Admitting: Obstetrics and Gynecology

## 2021-02-15 ENCOUNTER — Other Ambulatory Visit: Payer: Self-pay

## 2021-02-15 ENCOUNTER — Ambulatory Visit (INDEPENDENT_AMBULATORY_CARE_PROVIDER_SITE_OTHER): Payer: BC Managed Care – PPO | Admitting: Obstetrics and Gynecology

## 2021-02-15 ENCOUNTER — Other Ambulatory Visit: Payer: BC Managed Care – PPO

## 2021-02-15 VITALS — BP 112/74 | HR 85 | Wt 131.7 lb

## 2021-02-15 DIAGNOSIS — Z3402 Encounter for supervision of normal first pregnancy, second trimester: Secondary | ICD-10-CM

## 2021-02-15 DIAGNOSIS — Z3A29 29 weeks gestation of pregnancy: Secondary | ICD-10-CM | POA: Diagnosis not present

## 2021-02-15 DIAGNOSIS — Z23 Encounter for immunization: Secondary | ICD-10-CM

## 2021-02-15 LAB — POCT URINALYSIS DIPSTICK OB
Bilirubin, UA: NEGATIVE
Blood, UA: NEGATIVE
Glucose, UA: NEGATIVE
Ketones, UA: NEGATIVE
Leukocytes, UA: NEGATIVE
Nitrite, UA: NEGATIVE
POC,PROTEIN,UA: NEGATIVE
Spec Grav, UA: <=1.005 — AB (ref 1.010–1.025)
Urobilinogen, UA: 0.2 E.U./dL
pH, UA: 7 (ref 5.0–8.0)

## 2021-02-15 NOTE — Progress Notes (Signed)
ROB: No complaints- active movement.  1 hour GCT today.  Discussed labor positions.

## 2021-02-16 LAB — RPR: RPR Ser Ql: NONREACTIVE

## 2021-02-16 LAB — CBC
Hematocrit: 29 % — ABNORMAL LOW (ref 34.0–46.6)
Hemoglobin: 9.7 g/dL — ABNORMAL LOW (ref 11.1–15.9)
MCH: 30.2 pg (ref 26.6–33.0)
MCHC: 33.4 g/dL (ref 31.5–35.7)
MCV: 90 fL (ref 79–97)
Platelets: 239 10*3/uL (ref 150–450)
RBC: 3.21 x10E6/uL — ABNORMAL LOW (ref 3.77–5.28)
RDW: 11.6 % — ABNORMAL LOW (ref 11.7–15.4)
WBC: 10.2 10*3/uL (ref 3.4–10.8)

## 2021-02-16 LAB — GLUCOSE, 1 HOUR GESTATIONAL: Gestational Diabetes Screen: 124 mg/dL (ref 65–139)

## 2021-02-18 ENCOUNTER — Other Ambulatory Visit: Payer: Self-pay | Admitting: Obstetrics and Gynecology

## 2021-02-18 MED ORDER — DOCUSATE SODIUM 100 MG PO CAPS: 100.0000 mg | ORAL_CAPSULE | Freq: Every day | ORAL | 2 refills | Status: DC | PRN

## 2021-02-18 MED ORDER — FERROUS SULFATE 325 (65 FE) MG PO TABS: 325.0000 mg | ORAL_TABLET | Freq: Every day | ORAL | 1 refills | Status: DC

## 2021-03-08 ENCOUNTER — Encounter: Payer: BC Managed Care – PPO | Admitting: Obstetrics and Gynecology

## 2021-03-12 ENCOUNTER — Encounter: Payer: BC Managed Care – PPO | Admitting: Obstetrics and Gynecology

## 2021-03-15 NOTE — Patient Instructions (Addendum)
WHAT OB PATIENTS CAN EXPECT   Confirmation of pregnancy and ultrasound ordered if medically indicated-[redacted] weeks gestation  New OB (NOB) intake with nurse and New OB (NOB) labs- [redacted] weeks gestation  New OB (NOB) physical examination with provider- 11/[redacted] weeks gestation  Flu vaccine-[redacted] weeks gestation  Anatomy scan-[redacted] weeks gestation  Glucose tolerance test, blood work to test for anemia, T-dap vaccine-[redacted] weeks gestation  Vaginal swabs/cultures-STD/Group B strep-[redacted] weeks gestation  Appointments every 4 weeks until 28 weeks  Every 2 weeks from 28 weeks until 36 weeks  Weekly visits from 36 weeks until delivery  Breastfeeding  Choosing to breastfeed is one of the best decisions you can make for yourself and your baby. A change in hormones during pregnancy causes your breasts to make breast milk in your milk-producing glands. Hormones prevent breast milk from being released before your baby is born. They also prompt milk flow after birth. Once breastfeeding has begun, thoughts of your baby, as well as his or her sucking or crying, can stimulate the release of milk from your milk-producing glands. Benefits of breastfeeding Research shows that breastfeeding offers many health benefits for infants and mothers. It also offers a cost-free and convenient way to feed your baby. For your baby  Your first milk (colostrum) helps your baby's digestive system to function better.  Special cells in your milk (antibodies) help your baby to fight off infections.  Breastfed babies are less likely to develop asthma, allergies, obesity, or type 2 diabetes. They are also at lower risk for sudden infant death syndrome (SIDS).  Nutrients in breast milk are better able to meet your baby's needs compared to infant formula.  Breast milk improves your baby's brain development. For you  Breastfeeding helps to create a very special bond between you and your baby.  Breastfeeding is convenient. Breast milk costs  nothing and is always available at the correct temperature.  Breastfeeding helps to burn calories. It helps you to lose the weight that you gained during pregnancy.  Breastfeeding makes your uterus return faster to its size before pregnancy. It also slows bleeding (lochia) after you give birth.  Breastfeeding helps to lower your risk of developing type 2 diabetes, osteoporosis, rheumatoid arthritis, cardiovascular disease, and breast, ovarian, uterine, and endometrial cancer later in life. Breastfeeding basics Starting breastfeeding  Find a comfortable place to sit or lie down, with your neck and back well-supported.  Place a pillow or a rolled-up blanket under your baby to bring him or her to the level of your breast (if you are seated). Nursing pillows are specially designed to help support your arms and your baby while you breastfeed.  Make sure that your baby's tummy (abdomen) is facing your abdomen.  Gently massage your breast. With your fingertips, massage from the outer edges of your breast inward toward the nipple. This encourages milk flow. If your milk flows slowly, you may need to continue this action during the feeding.  Support your breast with 4 fingers underneath and your thumb above your nipple (make the letter "C" with your hand). Make sure your fingers are well away from your nipple and your baby's mouth.  Stroke your baby's lips gently with your finger or nipple.  When your baby's mouth is open wide enough, quickly bring your baby to your breast, placing your entire nipple and as much of the areola as possible into your baby's mouth. The areola is the colored area around your nipple. ? More areola should be visible above your  baby's upper lip than below the lower lip. ? Your baby's lips should be opened and extended outward (flanged) to ensure an adequate, comfortable latch. ? Your baby's tongue should be between his or her lower gum and your breast.  Make sure that your  baby's mouth is correctly positioned around your nipple (latched). Your baby's lips should create a seal on your breast and be turned out (everted).  It is common for your baby to suck about 2-3 minutes in order to start the flow of breast milk. Latching Teaching your baby how to latch onto your breast properly is very important. An improper latch can cause nipple pain, decreased milk supply, and poor weight gain in your baby. Also, if your baby is not latched onto your nipple properly, he or she may swallow some air during feeding. This can make your baby fussy. Burping your baby when you switch breasts during the feeding can help to get rid of the air. However, teaching your baby to latch on properly is still the best way to prevent fussiness from swallowing air while breastfeeding. Signs that your baby has successfully latched onto your nipple  Silent tugging or silent sucking, without causing you pain. Infant's lips should be extended outward (flanged).  Swallowing heard between every 3-4 sucks once your milk has started to flow (after your let-down milk reflex occurs).  Muscle movement above and in front of his or her ears while sucking. Signs that your baby has not successfully latched onto your nipple  Sucking sounds or smacking sounds from your baby while breastfeeding.  Nipple pain. If you think your baby has not latched on correctly, slip your finger into the corner of your baby's mouth to break the suction and place it between your baby's gums. Attempt to start breastfeeding again. Signs of successful breastfeeding Signs from your baby  Your baby will gradually decrease the number of sucks or will completely stop sucking.  Your baby will fall asleep.  Your baby's body will relax.  Your baby will retain a small amount of milk in his or her mouth.  Your baby will let go of your breast by himself or herself. Signs from you  Breasts that have increased in firmness, weight, and  size 1-3 hours after feeding.  Breasts that are softer immediately after breastfeeding.  Increased milk volume, as well as a change in milk consistency and color by the fifth day of breastfeeding.  Nipples that are not sore, cracked, or bleeding. Signs that your baby is getting enough milk  Wetting at least 1-2 diapers during the first 24 hours after birth.  Wetting at least 5-6 diapers every 24 hours for the first week after birth. The urine should be clear or pale yellow by the age of 5 days.  Wetting 6-8 diapers every 24 hours as your baby continues to grow and develop.  At least 3 stools in a 24-hour period by the age of 5 days. The stool should be soft and yellow.  At least 3 stools in a 24-hour period by the age of 7 days. The stool should be seedy and yellow.  No loss of weight greater than 10% of birth weight during the first 3 days of life.  Average weight gain of 4-7 oz (113-198 g) per week after the age of 4 days.  Consistent daily weight gain by the age of 5 days, without weight loss after the age of 2 weeks. After a feeding, your baby may spit up a  small amount of milk. This is normal. Breastfeeding frequency and duration Frequent feeding will help you make more milk and can prevent sore nipples and extremely full breasts (breast engorgement). Breastfeed when you feel the need to reduce the fullness of your breasts or when your baby shows signs of hunger. This is called "breastfeeding on demand." Signs that your baby is hungry include:  Increased alertness, activity, or restlessness.  Movement of the head from side to side.  Opening of the mouth when the corner of the mouth or cheek is stroked (rooting).  Increased sucking sounds, smacking lips, cooing, sighing, or squeaking.  Hand-to-mouth movements and sucking on fingers or hands.  Fussing or crying. Avoid introducing a pacifier to your baby in the first 4-6 weeks after your baby is born. After this time, you may  choose to use a pacifier. Research has shown that pacifier use during the first year of a baby's life decreases the risk of sudden infant death syndrome (SIDS). Allow your baby to feed on each breast as long as he or she wants. When your baby unlatches or falls asleep while feeding from the first breast, offer the second breast. Because newborns are often sleepy in the first few weeks of life, you may need to awaken your baby to get him or her to feed. Breastfeeding times will vary from baby to baby. However, the following rules can serve as a guide to help you make sure that your baby is properly fed:  Newborns (babies 58 weeks of age or younger) may breastfeed every 1-3 hours.  Newborns should not go without breastfeeding for longer than 3 hours during the day or 5 hours during the night.  You should breastfeed your baby a minimum of 8 times in a 24-hour period. Breast milk pumping Pumping and storing breast milk allows you to make sure that your baby is exclusively fed your breast milk, even at times when you are unable to breastfeed. This is especially important if you go back to work while you are still breastfeeding, or if you are not able to be present during feedings. Your lactation consultant can help you find a method of pumping that works best for you and give you guidelines about how long it is safe to store breast milk.      Caring for your breasts while you breastfeed Nipples can become dry, cracked, and sore while breastfeeding. The following recommendations can help keep your breasts moisturized and healthy:  Avoid using soap on your nipples.  Wear a supportive bra designed especially for nursing. Avoid wearing underwire-style bras or extremely tight bras (sports bras).  Air-dry your nipples for 3-4 minutes after each feeding.  Use only cotton bra pads to absorb leaked breast milk. Leaking of breast milk between feedings is normal.  Use lanolin on your nipples after  breastfeeding. Lanolin helps to maintain your skin's normal moisture barrier. Pure lanolin is not harmful (not toxic) to your baby. You may also hand express a few drops of breast milk and gently massage that milk into your nipples and allow the milk to air-dry. In the first few weeks after giving birth, some women experience breast engorgement. Engorgement can make your breasts feel heavy, warm, and tender to the touch. Engorgement peaks within 3-5 days after you give birth. The following recommendations can help to ease engorgement:  Completely empty your breasts while breastfeeding or pumping. You may want to start by applying warm, moist heat (in the shower or with warm,  water-soaked hand towels) just before feeding or pumping. This increases circulation and helps the milk flow. If your baby does not completely empty your breasts while breastfeeding, pump any extra milk after he or she is finished.  Apply ice packs to your breasts immediately after breastfeeding or pumping, unless this is too uncomfortable for you. To do this: ? Put ice in a plastic bag. ? Place a towel between your skin and the bag. ? Leave the ice on for 20 minutes, 2-3 times a day.  Make sure that your baby is latched on and positioned properly while breastfeeding. If engorgement persists after 48 hours of following these recommendations, contact your health care provider or a Science writer. Overall health care recommendations while breastfeeding  Eat 3 healthy meals and 3 snacks every day. Well-nourished mothers who are breastfeeding need an additional 450-500 calories a day. You can meet this requirement by increasing the amount of a balanced diet that you eat.  Drink enough water to keep your urine pale yellow or clear.  Rest often, relax, and continue to take your prenatal vitamins to prevent fatigue, stress, and low vitamin and mineral levels in your body (nutrient deficiencies).  Do not use any products that  contain nicotine or tobacco, such as cigarettes and e-cigarettes. Your baby may be harmed by chemicals from cigarettes that pass into breast milk and exposure to secondhand smoke. If you need help quitting, ask your health care provider.  Avoid alcohol.  Do not use illegal drugs or marijuana.  Talk with your health care provider before taking any medicines. These include over-the-counter and prescription medicines as well as vitamins and herbal supplements. Some medicines that may be harmful to your baby can pass through breast milk.  It is possible to become pregnant while breastfeeding. If birth control is desired, ask your health care provider about options that will be safe while breastfeeding your baby. Where to find more information: [employer]: www.llli.org Contact a health care provider if:  You feel like you want to stop breastfeeding or have become frustrated with breastfeeding.  Your nipples are cracked or bleeding.  Your breasts are red, tender, or warm.  You have: ? Painful breasts or nipples. ? A swollen area on either breast. ? A fever or chills. ? Nausea or vomiting. ? Drainage other than breast milk from your nipples.  Your breasts do not become full before feedings by the fifth day after you give birth.  You feel sad and depressed.  Your baby is: ? Too sleepy to eat well. ? Having trouble sleeping. ? More than 71 week old and wetting fewer than 6 diapers in a 24-hour period. ? Not gaining weight by 44 days of age.  Your baby has fewer than 3 stools in a 24-hour period.  Your baby's skin or the white parts of his or her eyes become yellow. Get help right away if:  Your baby is overly tired (lethargic) and does not want to wake up and feed.  Your baby develops an unexplained fever. Summary  Breastfeeding offers many health benefits for infant and mothers.  Try to breastfeed your infant when he or she shows early signs of  hunger.  Gently tickle or stroke your baby's lips with your finger or nipple to allow the baby to open his or her mouth. Bring the baby to your breast. Make sure that much of the areola is in your baby's mouth. Offer one side and burp the baby before you  offer the other side.  Talk with your health care provider or lactation consultant if you have questions or you face problems as you breastfeed. This information is not intended to replace advice given to you by your health care provider. Make sure you discuss any questions you have with your health care provider. Document Revised: 12/24/2017 Document Reviewed: 10/31/2016 Elsevier Patient Education  2021 [place] of Pregnancy  The third trimester of pregnancy is from week 28 through week 58. This is also called months 7 through 9. This trimester is when your unborn baby (fetus) is growing very fast. At the end of the ninth month, the unborn baby is about 20 inches long. It weighs about 6-10 pounds. Body changes during your third trimester Your body continues to go through many changes during this time. The changes vary and generally return to normal after the baby is born. Physical changes  Your weight will continue to increase. You may gain 25-35 pounds (11-16 kg) by the end of the pregnancy. If you are underweight, you may gain 28-40 lb (about 13-18 kg). If you are overweight, you may gain 15-25 lb (about 7-11 kg).  You may start to get stretch marks on your hips, belly (abdomen), and breasts.  Your breasts will continue to grow and may hurt. A yellow fluid (colostrum) may leak from your breasts. This is the first milk you are making for your baby.  You may have changes in your hair.  Your belly button may stick out.  You may have more swelling in your hands, face, or ankles. Health changes  You may have heartburn.  You may have trouble pooping (constipation).  You may get hemorrhoids. These are swollen veins  in the butt that can itch or get painful.  You may have swollen veins (varicose veins) in your legs.  You may have more body aches in the pelvis, back, or thighs.  You may have more tingling or numbness in your hands, arms, and legs. The skin on your belly may also feel numb.  You may feel short of breath as your womb (uterus) gets bigger. Other changes  You may pee (urinate) more often.  You may have more problems sleeping.  You may notice the unborn baby "dropping," or moving lower in your belly.  You may have more discharge coming from your vagina.  Your joints may feel loose, and you may have pain around your pelvic bone. Follow these instructions at home: Medicines  Take over-the-counter and prescription medicines only as told by your doctor. Some medicines are not safe during pregnancy.  Take a prenatal vitamin that contains at least 600 micrograms (mcg) of folic acid. Eating and drinking  Eat healthy meals that include: ? Fresh fruits and vegetables. ? Whole grains. ? Good sources of protein, such as meat, eggs, or tofu. ? Low-fat dairy products.  Avoid raw meat and unpasteurized juice, milk, and cheese. These carry germs that can harm you and your baby.  Eat 4 or 5 small meals rather than 3 large meals a day.  You may need to take these actions to prevent or treat trouble pooping: ? Drink enough fluids to keep your pee (urine) pale yellow. ? Eat foods that are high in fiber. These include beans, whole grains, and fresh fruits and vegetables. ? Limit foods that are high in fat and sugar. These include fried or sweet foods. Activity  Exercise only as told by your doctor. Stop exercising if you start to  have cramps in your womb.  Avoid heavy lifting.  Do not exercise if it is too hot or too humid, or if you are in a place of great height (high altitude).  If you choose to, you may have sex unless your doctor tells you not to. Relieving pain and  discomfort  Take breaks often, and rest with your legs raised (elevated) if you have leg cramps or low back pain.  Take warm water baths (sitz baths) to soothe pain or discomfort caused by hemorrhoids. Use hemorrhoid cream if your doctor approves.  Wear a good support bra if your breasts are tender.  If you develop bulging, swollen veins in your legs: ? Wear support hose as told by your doctor. ? Raise your feet for 15 minutes, 3-4 times a day. ? Limit salt in your food. Safety  Talk to your doctor before traveling far distances.  Do not use hot tubs, steam rooms, or saunas.  Wear your seat belt at all times when you are in a car.  Talk with your doctor if someone is hurting you or yelling at you a lot. Preparing for your baby's arrival To prepare for the arrival of your baby:  Take prenatal classes.  Visit the hospital and tour the maternity area.  Buy a rear-facing car seat. Learn how to install it in your car.  Prepare the baby's room. Take out all pillows and stuffed animals from the baby's crib. General instructions  Avoid cat litter boxes and soil used by cats. These carry germs that can cause harm to the baby and can cause a loss of your baby by miscarriage or stillbirth.  Do not douche or use tampons. Do not use scented sanitary pads.  Do not smoke or use any products that contain nicotine or tobacco. If you need help quitting, ask your doctor.  Do not drink alcohol.  Do not use herbal medicines, illegal drugs, or medicines that were not approved by your doctor. Chemicals in these products can affect your baby.  Keep all follow-up visits. This is important. Where to find more information  American Pregnancy Association: americanpregnancy.org  [employer]stetricians [employer]necologists: www.acog.org  Office on Women's Health: KeywordPortfolios.com.br Contact a doctor if:  You have a fever.  You have mild cramps or pressure in your lower  belly.  You have a nagging pain in your belly area.  You vomit, or you have watery poop (diarrhea).  You have bad-smelling fluid coming from your vagina.  You have pain when you pee, or your pee smells bad.  You have a headache that does not go away when you take medicine.  You have changes in how you see, or you see spots in front of your eyes. Get help right away if:  Your water breaks.  You have regular contractions that are less than 5 minutes apart.  You are spotting or bleeding from your vagina.  You have very bad belly cramps or pain.  You have trouble breathing.  You have chest pain.  You faint.  You have not felt the baby move for the amount of time told by your doctor.  You have new or increased pain, swelling, or redness in an arm or leg. Summary  The third trimester is from week 28 through week 40 (months 7 through 9). This is the time when your unborn baby is growing very fast.  During this time, your discomfort may increase as you gain weight and as your baby grows.  Get ready for your baby to arrive by taking prenatal classes, buying a rear-facing car seat, and preparing the baby's room.  Get help right away if you are bleeding from your vagina, you have chest pain and trouble breathing, or you have not felt the baby move for the amount of time told by your doctor. This information is not intended to replace advice given to you by your health care provider. Make sure you discuss any questions you have with your health care provider. Document Revised: 03/07/2020 Document Reviewed: 01/12/2020 Elsevier Patient Education  [place]. Common Medications Safe in Pregnancy  Acne:      Constipation:  Benzoyl Peroxide     Colace  Clindamycin      Dulcolax Suppository  Topica Erythromycin     Fibercon  Salicylic Acid      Metamucil         Miralax AVOID:        Senakot   Accutane    Cough:  Retin-A       Cough Drops  Tetracycline      Phenergan w/  Codeine if Rx  Minocycline      Robitussin (Plain & DM)  Antibiotics:     Crabs/Lice:  Ceclor       RID  Cephalosporins    AVOID:  E-Mycins      Kwell  Keflex  Macrobid/Macrodantin   Diarrhea:  Penicillin      Kao-Pectate  Zithromax      Imodium AD         PUSH FLUIDS AVOID:       Cipro     Fever:  Tetracycline      Tylenol (Regular or Extra  Minocycline       Strength)  Levaquin      Extra Strength-Do not          Exceed 8 tabs/24 hrs Caffeine:        <26m/day (equiv. To 1 cup of coffee or  approx. 3 12 oz sodas)         Gas: Cold/Hayfever:       Gas-X  Benadryl      Mylicon  Claritin       Phazyme  **Claritin-D        Chlor-Trimeton    Headaches:  Dimetapp      ASA-Free Excedrin  Drixoral-Non-Drowsy     Cold Compress  Mucinex (Guaifenasin)     Tylenol (Regular or Extra  Sudafed/Sudafed-12 Hour     Strength)  **Sudafed PE Pseudoephedrine   Tylenol Cold & Sinus     Vicks Vapor Rub  Zyrtec  **AVOID if Problems With Blood Pressure         Heartburn: Avoid lying down for at least 1 hour after meals  Aciphex      Maalox     Rash:  Milk of Magnesia     Benadryl    Mylanta       1% Hydrocortisone Cream  Pepcid  Pepcid Complete   Sleep Aids:  Prevacid      Ambien   Prilosec       Benadryl  Rolaids       Chamomile Tea  Tums (Limit 4/day)     Unisom         Tylenol PM         Warm milk-add vanilla or  Hemorrhoids:       Sugar for taste  Anusol/Anusol H.C.  (RX: Analapram 2.5%)  Sugar Substitutes:  Hydrocortisone OTC     Ok in moderation  Preparation H      Tucks        Vaseline lotion applied to tissue with wiping    Herpes:     Throat:  Acyclovir      Oragel  Famvir  Valtrex     Vaccines:         Flu Shot Leg Cramps:       *Gardasil  Benadryl      Hepatitis A         Hepatitis B Nasal Spray:       Pneumovax  Saline Nasal Spray     Polio Booster         Tetanus Nausea:       Tuberculosis test or PPD  Vitamin B6 25 mg  TID   AVOID:    Dramamine      *Gardasil  Emetrol       Live Poliovirus  Ginger Root 250 mg QID    MMR (measles, mumps &  High Complex Carbs @ Bedtime    rebella)  Sea Bands-Accupressure    Varicella (Chickenpox)  Unisom 1/2 tab TID     *No known complications           If received before Pain:         Known pregnancy;   Darvocet       Resume series after  Lortab        Delivery  Percocet    Yeast:   Tramadol      Femstat  Tylenol 3      Gyne-lotrimin  Ultram       Monistat  Vicodin           MISC:         All Sunscreens           Hair Coloring/highlights          Insect Repellant's          (Including DEET)         Mystic Tans  Breastfeeding  Choosing to breastfeed is one of the best decisions you can make for yourself and your baby. A change in hormones during pregnancy causes your breasts to make breast milk in your milk-producing glands. Hormones prevent breast milk from being released before your baby is born. They also prompt milk flow after birth. Once breastfeeding has begun, thoughts of your baby, as well as his or her sucking or crying, can stimulate the release of milk from your milk-producing glands. Benefits of breastfeeding Research shows that breastfeeding offers many health benefits for infants and mothers. It also offers a cost-free and convenient way to feed your baby. For your baby  Your first milk (colostrum) helps your baby's digestive system to function better.  Special cells in your milk (antibodies) help your baby to fight off infections.  Breastfed babies are less likely to develop asthma, allergies, obesity, or type 2 diabetes. They are also at lower risk for sudden infant death syndrome (SIDS).  Nutrients in breast milk are better able to meet your baby's needs compared to infant formula.  Breast milk improves your baby's brain development. For you  Breastfeeding helps to create a very special bond between you and your baby.  Breastfeeding is  convenient. Breast milk costs nothing and is always available at the correct temperature.  Breastfeeding helps to burn calories. It helps you to lose the weight that you gained during pregnancy.  Breastfeeding makes  your uterus return faster to its size before pregnancy. It also slows bleeding (lochia) after you give birth.  Breastfeeding helps to lower your risk of developing type 2 diabetes, osteoporosis, rheumatoid arthritis, cardiovascular disease, and breast, ovarian, uterine, and endometrial cancer later in life. Breastfeeding basics Starting breastfeeding  Find a comfortable place to sit or lie down, with your neck and back well-supported.  Place a pillow or a rolled-up blanket under your baby to bring him or her to the level of your breast (if you are seated). Nursing pillows are specially designed to help support your arms and your baby while you breastfeed.  Make sure that your baby's tummy (abdomen) is facing your abdomen.  Gently massage your breast. With your fingertips, massage from the outer edges of your breast inward toward the nipple. This encourages milk flow. If your milk flows slowly, you may need to continue this action during the feeding.  Support your breast with 4 fingers underneath and your thumb above your nipple (make the letter "C" with your hand). Make sure your fingers are well away from your nipple and your baby's mouth.  Stroke your baby's lips gently with your finger or nipple.  When your baby's mouth is open wide enough, quickly bring your baby to your breast, placing your entire nipple and as much of the areola as possible into your baby's mouth. The areola is the colored area around your nipple. ? More areola should be visible above your baby's upper lip than below the lower lip. ? Your baby's lips should be opened and extended outward (flanged) to ensure an adequate, comfortable latch. ? Your baby's tongue should be between his or her lower gum and your  breast.  Make sure that your baby's mouth is correctly positioned around your nipple (latched). Your baby's lips should create a seal on your breast and be turned out (everted).  It is common for your baby to suck about 2-3 minutes in order to start the flow of breast milk. Latching Teaching your baby how to latch onto your breast properly is very important. An improper latch can cause nipple pain, decreased milk supply, and poor weight gain in your baby. Also, if your baby is not latched onto your nipple properly, he or she may swallow some air during feeding. This can make your baby fussy. Burping your baby when you switch breasts during the feeding can help to get rid of the air. However, teaching your baby to latch on properly is still the best way to prevent fussiness from swallowing air while breastfeeding. Signs that your baby has successfully latched onto your nipple  Silent tugging or silent sucking, without causing you pain. Infant's lips should be extended outward (flanged).  Swallowing heard between every 3-4 sucks once your milk has started to flow (after your let-down milk reflex occurs).  Muscle movement above and in front of his or her ears while sucking. Signs that your baby has not successfully latched onto your nipple  Sucking sounds or smacking sounds from your baby while breastfeeding.  Nipple pain. If you think your baby has not latched on correctly, slip your finger into the corner of your baby's mouth to break the suction and place it between your baby's gums. Attempt to start breastfeeding again. Signs of successful breastfeeding Signs from your baby  Your baby will gradually decrease the number of sucks or will completely stop sucking.  Your baby will fall asleep.  Your baby's body will relax.  Your baby  will retain a small amount of milk in his or her mouth.  Your baby will let go of your breast by himself or herself. Signs from you  Breasts that have  increased in firmness, weight, and size 1-3 hours after feeding.  Breasts that are softer immediately after breastfeeding.  Increased milk volume, as well as a change in milk consistency and color by the fifth day of breastfeeding.  Nipples that are not sore, cracked, or bleeding. Signs that your baby is getting enough milk  Wetting at least 1-2 diapers during the first 24 hours after birth.  Wetting at least 5-6 diapers every 24 hours for the first week after birth. The urine should be clear or pale yellow by the age of 5 days.  Wetting 6-8 diapers every 24 hours as your baby continues to grow and develop.  At least 3 stools in a 24-hour period by the age of 5 days. The stool should be soft and yellow.  At least 3 stools in a 24-hour period by the age of 7 days. The stool should be seedy and yellow.  No loss of weight greater than 10% of birth weight during the first 3 days of life.  Average weight gain of 4-7 oz (113-198 g) per week after the age of 4 days.  Consistent daily weight gain by the age of 5 days, without weight loss after the age of 2 weeks. After a feeding, your baby may spit up a small amount of milk. This is normal. Breastfeeding frequency and duration Frequent feeding will help you make more milk and can prevent sore nipples and extremely full breasts (breast engorgement). Breastfeed when you feel the need to reduce the fullness of your breasts or when your baby shows signs of hunger. This is called "breastfeeding on demand." Signs that your baby is hungry include:  Increased alertness, activity, or restlessness.  Movement of the head from side to side.  Opening of the mouth when the corner of the mouth or cheek is stroked (rooting).  Increased sucking sounds, smacking lips, cooing, sighing, or squeaking.  Hand-to-mouth movements and sucking on fingers or hands.  Fussing or crying. Avoid introducing a pacifier to your baby in the first 4-6 weeks after your  baby is born. After this time, you may choose to use a pacifier. Research has shown that pacifier use during the first year of a baby's life decreases the risk of sudden infant death syndrome (SIDS). Allow your baby to feed on each breast as long as he or she wants. When your baby unlatches or falls asleep while feeding from the first breast, offer the second breast. Because newborns are often sleepy in the first few weeks of life, you may need to awaken your baby to get him or her to feed. Breastfeeding times will vary from baby to baby. However, the following rules can serve as a guide to help you make sure that your baby is properly fed:  Newborns (babies 76 weeks of age or younger) may breastfeed every 1-3 hours.  Newborns should not go without breastfeeding for longer than 3 hours during the day or 5 hours during the night.  You should breastfeed your baby a minimum of 8 times in a 24-hour period. Breast milk pumping Pumping and storing breast milk allows you to make sure that your baby is exclusively fed your breast milk, even at times when you are unable to breastfeed. This is especially important if you go back to work while  you are still breastfeeding, or if you are not able to be present during feedings. Your lactation consultant can help you find a method of pumping that works best for you and give you guidelines about how long it is safe to store breast milk.      Caring for your breasts while you breastfeed Nipples can become dry, cracked, and sore while breastfeeding. The following recommendations can help keep your breasts moisturized and healthy:  Avoid using soap on your nipples.  Wear a supportive bra designed especially for nursing. Avoid wearing underwire-style bras or extremely tight bras (sports bras).  Air-dry your nipples for 3-4 minutes after each feeding.  Use only cotton bra pads to absorb leaked breast milk. Leaking of breast milk between feedings is normal.  Use  lanolin on your nipples after breastfeeding. Lanolin helps to maintain your skin's normal moisture barrier. Pure lanolin is not harmful (not toxic) to your baby. You may also hand express a few drops of breast milk and gently massage that milk into your nipples and allow the milk to air-dry. In the first few weeks after giving birth, some women experience breast engorgement. Engorgement can make your breasts feel heavy, warm, and tender to the touch. Engorgement peaks within 3-5 days after you give birth. The following recommendations can help to ease engorgement:  Completely empty your breasts while breastfeeding or pumping. You may want to start by applying warm, moist heat (in the shower or with warm, water-soaked hand towels) just before feeding or pumping. This increases circulation and helps the milk flow. If your baby does not completely empty your breasts while breastfeeding, pump any extra milk after he or she is finished.  Apply ice packs to your breasts immediately after breastfeeding or pumping, unless this is too uncomfortable for you. To do this: ? Put ice in a plastic bag. ? Place a towel between your skin and the bag. ? Leave the ice on for 20 minutes, 2-3 times a day.  Make sure that your baby is latched on and positioned properly while breastfeeding. If engorgement persists after 48 hours of following these recommendations, contact your health care provider or a Science writer. Overall health care recommendations while breastfeeding  Eat 3 healthy meals and 3 snacks every day. Well-nourished mothers who are breastfeeding need an additional 450-500 calories a day. You can meet this requirement by increasing the amount of a balanced diet that you eat.  Drink enough water to keep your urine pale yellow or clear.  Rest often, relax, and continue to take your prenatal vitamins to prevent fatigue, stress, and low vitamin and mineral levels in your body (nutrient  deficiencies).  Do not use any products that contain nicotine or tobacco, such as cigarettes and e-cigarettes. Your baby may be harmed by chemicals from cigarettes that pass into breast milk and exposure to secondhand smoke. If you need help quitting, ask your health care provider.  Avoid alcohol.  Do not use illegal drugs or marijuana.  Talk with your health care provider before taking any medicines. These include over-the-counter and prescription medicines as well as vitamins and herbal supplements. Some medicines that may be harmful to your baby can pass through breast milk.  It is possible to become pregnant while breastfeeding. If birth control is desired, ask your health care provider about options that will be safe while breastfeeding your baby. Where to find more information: [employer]: www.llli.org Contact a health care provider if:  You feel like  you want to stop breastfeeding or have become frustrated with breastfeeding.  Your nipples are cracked or bleeding.  Your breasts are red, tender, or warm.  You have: ? Painful breasts or nipples. ? A swollen area on either breast. ? A fever or chills. ? Nausea or vomiting. ? Drainage other than breast milk from your nipples.  Your breasts do not become full before feedings by the fifth day after you give birth.  You feel sad and depressed.  Your baby is: ? Too sleepy to eat well. ? Having trouble sleeping. ? More than 13 week old and wetting fewer than 6 diapers in a 24-hour period. ? Not gaining weight by 51 days of age.  Your baby has fewer than 3 stools in a 24-hour period.  Your baby's skin or the white parts of his or her eyes become yellow. Get help right away if:  Your baby is overly tired (lethargic) and does not want to wake up and feed.  Your baby develops an unexplained fever. Summary  Breastfeeding offers many health benefits for infant and mothers.  Try to breastfeed your infant when  he or she shows early signs of hunger.  Gently tickle or stroke your baby's lips with your finger or nipple to allow the baby to open his or her mouth. Bring the baby to your breast. Make sure that much of the areola is in your baby's mouth. Offer one side and burp the baby before you offer the other side.  Talk with your health care provider or lactation consultant if you have questions or you face problems as you breastfeed. This information is not intended to replace advice given to you by your health care provider. Make sure you discuss any questions you have with your health care provider. Document Revised: 12/24/2017 Document Reviewed: 10/31/2016 Elsevier Patient Education  2021 [employer].

## 2021-03-19 ENCOUNTER — Other Ambulatory Visit: Payer: Self-pay

## 2021-03-19 ENCOUNTER — Ambulatory Visit (INDEPENDENT_AMBULATORY_CARE_PROVIDER_SITE_OTHER): Payer: BC Managed Care – PPO | Admitting: Obstetrics and Gynecology

## 2021-03-19 ENCOUNTER — Encounter: Payer: Self-pay | Admitting: Obstetrics and Gynecology

## 2021-03-19 VITALS — BP 119/82 | HR 67 | Wt 133.4 lb

## 2021-03-19 DIAGNOSIS — Z3A33 33 weeks gestation of pregnancy: Secondary | ICD-10-CM

## 2021-03-19 DIAGNOSIS — Z3403 Encounter for supervision of normal first pregnancy, third trimester: Secondary | ICD-10-CM

## 2021-03-19 LAB — POCT URINALYSIS DIPSTICK OB
Bilirubin, UA: NEGATIVE
Blood, UA: NEGATIVE
Glucose, UA: NEGATIVE
Ketones, UA: NEGATIVE
Leukocytes, UA: NEGATIVE
Nitrite, UA: NEGATIVE
POC,PROTEIN,UA: NEGATIVE
Spec Grav, UA: 1.01 (ref 1.010–1.025)
Urobilinogen, UA: 0.2 E.U./dL
pH, UA: 6 (ref 5.0–8.0)

## 2021-03-19 NOTE — Progress Notes (Signed)
ROB: Doing well, no complaints.  Has questions about fetal kick counts. Questions answered. Discussed breastfeeding. RTC in 2 weeks.    The following were addressed during this visit:  Breastfeeding Education - Early initiation of breastfeeding    Comments: Keeps milk supply adequate, helps contract uterus and slow bleeding, and early milk is the perfect first food and is easy to digest.   - The importance of exclusive breastfeeding    Comments: Provides antibodies, Lower risk of breast and ovarian cancers, and type-2 diabetes,Helps your body recover, Reduced chance of SIDS.   - Risks of giving your baby anything other than breast milk if you are breastfeeding    Comments: Make the baby less content with breastfeeds, may make my baby more susceptible to illness, and may reduce my milk supply.   - Nonpharmacological pain relief methods for labor    Comments: Deep breathing, focusing on pleasant things, movement and walking, heating pads or cold compress, massage and relaxation, continuous support from someone you trust, and Doulas   - The importance of early skin-to-skin contact    Comments: Keeps baby warm and secure, helps keep baby's blood sugar up and breathing steady, easier to bond and breastfeed, and helps calm baby.  - Rooming-in on a 24-hour basis    Comments: Easier to learn baby's feeding cues, easier to bond and get to know each other, and encourages milk production.   - Feeding on demand or baby-led feeding    Comments: Helps prevent breastfeeding complications, helps bring in good milk supply, prevents under or overfeeding, and helps baby feel content and satisfied   - Frequent feeding to help assure optimal milk production    Comments: Making a full supply of milk requires frequent removal of milk from breasts, infant will eat 8-12 times in 24 hours, if separated from infant use breast massage, hand expression and/ or pumping to remove milk from breasts.   -  Effective positioning and attachment    Comments: Helps my baby to get enough breast milk, helps to produce an adequate milk supply, and helps prevent nipple pain and damage   - Exclusive breastfeeding for the first 6 months    Comments: Builds a healthy milk supply and keeps it up, protects baby from sickness and disease, and breastmilk has everything your baby needs for the first 6 months.  - Individualized Education    Comments: Contraindications to breastfeeding and other special medical conditions

## 2021-03-19 NOTE — Progress Notes (Signed)
ROB: She is doing well today, she has no new concerns.

## 2021-04-05 ENCOUNTER — Ambulatory Visit (INDEPENDENT_AMBULATORY_CARE_PROVIDER_SITE_OTHER): Payer: BC Managed Care – PPO | Admitting: Obstetrics and Gynecology

## 2021-04-05 ENCOUNTER — Other Ambulatory Visit: Payer: Self-pay

## 2021-04-05 VITALS — BP 118/83 | HR 81 | Wt 136.9 lb

## 2021-04-05 DIAGNOSIS — Z3A36 36 weeks gestation of pregnancy: Secondary | ICD-10-CM

## 2021-04-05 DIAGNOSIS — Z3403 Encounter for supervision of normal first pregnancy, third trimester: Secondary | ICD-10-CM

## 2021-04-05 LAB — POCT URINALYSIS DIPSTICK
Bilirubin, UA: NEGATIVE
Blood, UA: NEGATIVE
Glucose, UA: NEGATIVE
Ketones, UA: NEGATIVE
Leukocytes, UA: NEGATIVE
Nitrite, UA: NEGATIVE
Protein, UA: NEGATIVE
Spec Grav, UA: 1.01 (ref 1.010–1.025)
Urobilinogen, UA: 0.2 E.U./dL
pH, UA: 6.5 (ref 5.0–8.0)

## 2021-04-05 NOTE — Progress Notes (Signed)
ROB: No complaints.  Denies contractions.  Signs and symptoms of labor reviewed.  Cultures performed GC/CT-GBS.

## 2021-04-05 NOTE — Addendum Note (Signed)
Addended by: Lady Deutscher on: 04/05/2021 09:10 AM   Modules accepted: Orders

## 2021-04-11 ENCOUNTER — Ambulatory Visit (INDEPENDENT_AMBULATORY_CARE_PROVIDER_SITE_OTHER): Payer: BC Managed Care – PPO | Admitting: Certified Nurse Midwife

## 2021-04-11 ENCOUNTER — Other Ambulatory Visit: Payer: Self-pay

## 2021-04-11 VITALS — BP 118/82 | HR 97 | Wt 138.2 lb

## 2021-04-11 DIAGNOSIS — Z3403 Encounter for supervision of normal first pregnancy, third trimester: Secondary | ICD-10-CM

## 2021-04-11 DIAGNOSIS — Z3A36 36 weeks gestation of pregnancy: Secondary | ICD-10-CM

## 2021-04-11 LAB — POCT URINALYSIS DIPSTICK OB
Bilirubin, UA: NEGATIVE
Blood, UA: NEGATIVE
Glucose, UA: NEGATIVE
Ketones, UA: NEGATIVE
Leukocytes, UA: NEGATIVE
Nitrite, UA: NEGATIVE
POC,PROTEIN,UA: NEGATIVE
Spec Grav, UA: <=1.005 — AB (ref 1.010–1.025)
Urobilinogen, UA: 0.2 E.U./dL
pH, UA: 7.5 (ref 5.0–8.0)

## 2021-04-11 LAB — GC/CHLAMYDIA PROBE AMP
Chlamydia trachomatis, NAA: NEGATIVE
Neisseria Gonorrhoeae by PCR: NEGATIVE

## 2021-04-11 LAB — STREP GP B NAA: Strep Gp B NAA: NEGATIVE

## 2021-04-11 NOTE — Progress Notes (Signed)
ROB-Reports right-sided "pinching" back pain. Discussed home treatment measures including use of OTC and abdominal support. Pre-labor checklist, herbal prep guide, and three sisters of balance handouts provided. Reviewed red flag symptoms and when to call. RTC x 1 week for ROB as previously scheduled or sooner if needed.   MD patient seen by CNM due to surgical schedule.

## 2021-04-11 NOTE — Progress Notes (Signed)
Pt presents for routine prenatal visit. [redacted]w[redacted]d. Having some back pain, mid R side of back.

## 2021-04-11 NOTE — Patient Instructions (Signed)

## 2021-04-18 ENCOUNTER — Ambulatory Visit (INDEPENDENT_AMBULATORY_CARE_PROVIDER_SITE_OTHER): Payer: BC Managed Care – PPO | Admitting: Obstetrics and Gynecology

## 2021-04-18 ENCOUNTER — Encounter: Payer: Self-pay | Admitting: Obstetrics and Gynecology

## 2021-04-18 ENCOUNTER — Other Ambulatory Visit: Payer: Self-pay

## 2021-04-18 VITALS — BP 123/85 | HR 85 | Wt 138.9 lb

## 2021-04-18 DIAGNOSIS — Z3A37 37 weeks gestation of pregnancy: Secondary | ICD-10-CM

## 2021-04-18 DIAGNOSIS — Z3403 Encounter for supervision of normal first pregnancy, third trimester: Secondary | ICD-10-CM

## 2021-04-18 LAB — POCT URINALYSIS DIPSTICK OB
Bilirubin, UA: NEGATIVE
Blood, UA: NEGATIVE
Glucose, UA: NEGATIVE
Ketones, UA: NEGATIVE
Leukocytes, UA: NEGATIVE
Nitrite, UA: NEGATIVE
POC,PROTEIN,UA: NEGATIVE
Spec Grav, UA: 1.015 (ref 1.010–1.025)
Urobilinogen, UA: 0.2 E.U./dL
pH, UA: 7 (ref 5.0–8.0)

## 2021-04-18 NOTE — Progress Notes (Signed)
ROB: Doing well.  Cultures reviewed.  Signs and symptoms of labor discussed.

## 2021-04-24 NOTE — Patient Instructions (Signed)

## 2021-04-25 ENCOUNTER — Ambulatory Visit (INDEPENDENT_AMBULATORY_CARE_PROVIDER_SITE_OTHER): Payer: BC Managed Care – PPO | Admitting: Obstetrics and Gynecology

## 2021-04-25 ENCOUNTER — Encounter: Payer: Self-pay | Admitting: Obstetrics and Gynecology

## 2021-04-25 ENCOUNTER — Other Ambulatory Visit: Payer: Self-pay

## 2021-04-25 VITALS — BP 131/86 | HR 89 | Wt 140.8 lb

## 2021-04-25 DIAGNOSIS — Z3403 Encounter for supervision of normal first pregnancy, third trimester: Secondary | ICD-10-CM

## 2021-04-25 DIAGNOSIS — Z3A38 38 weeks gestation of pregnancy: Secondary | ICD-10-CM

## 2021-04-25 LAB — POCT URINALYSIS DIPSTICK OB
Bilirubin, UA: NEGATIVE
Blood, UA: NEGATIVE
Glucose, UA: NEGATIVE
Ketones, UA: NEGATIVE
Leukocytes, UA: NEGATIVE
Nitrite, UA: NEGATIVE
POC,PROTEIN,UA: NEGATIVE
Spec Grav, UA: 1.01 (ref 1.010–1.025)
Urobilinogen, UA: 0.2 E.U./dL
pH, UA: 7 (ref 5.0–8.0)

## 2021-04-25 NOTE — Progress Notes (Signed)
OB-Pt present for routine prenatal care. Pt stated having lower abd pressure and mild cramping.

## 2021-04-25 NOTE — Progress Notes (Signed)
ROB: Doing well. No major complaints.  Discussed labor precautions. Discussed IOL further if no delivery by 41 weeks. Using herbal cervical preps. RTC in 1 week.

## 2021-05-02 ENCOUNTER — Encounter: Payer: Self-pay | Admitting: Obstetrics and Gynecology

## 2021-05-02 ENCOUNTER — Other Ambulatory Visit: Payer: Self-pay

## 2021-05-02 ENCOUNTER — Ambulatory Visit (INDEPENDENT_AMBULATORY_CARE_PROVIDER_SITE_OTHER): Payer: BC Managed Care – PPO | Admitting: Obstetrics and Gynecology

## 2021-05-02 VITALS — BP 122/86 | HR 86 | Wt 141.6 lb

## 2021-05-02 DIAGNOSIS — Z3A39 39 weeks gestation of pregnancy: Secondary | ICD-10-CM

## 2021-05-02 DIAGNOSIS — Z3403 Encounter for supervision of normal first pregnancy, third trimester: Secondary | ICD-10-CM

## 2021-05-02 NOTE — Progress Notes (Signed)
ROB: "Mild" contractions.  No complaints.  Induction scheduled for 7-27 5 AM.  Induction discussed in detail.  Patient for NST for postdates with next visit.  COVID testing after NST.

## 2021-05-02 NOTE — Addendum Note (Signed)
Addended by: Elonda Husky on: 05/02/2021 09:20 AM   Modules accepted: Orders, SmartSet

## 2021-05-06 ENCOUNTER — Other Ambulatory Visit
Admission: RE | Admit: 2021-05-06 | Discharge: 2021-05-06 | Disposition: A | Discharge status: Home / Self Care | Payer: BC Managed Care – PPO | Source: Ambulatory Visit | Attending: Certified Nurse Midwife | Admitting: Certified Nurse Midwife

## 2021-05-06 ENCOUNTER — Ambulatory Visit (INDEPENDENT_AMBULATORY_CARE_PROVIDER_SITE_OTHER): Payer: BC Managed Care – PPO | Admitting: Certified Nurse Midwife

## 2021-05-06 ENCOUNTER — Other Ambulatory Visit: Payer: Self-pay

## 2021-05-06 ENCOUNTER — Other Ambulatory Visit: Payer: BC Managed Care – PPO

## 2021-05-06 VITALS — BP 120/84 | HR 76 | Wt 143.6 lb

## 2021-05-06 DIAGNOSIS — Z3A35 35 weeks gestation of pregnancy: Secondary | ICD-10-CM

## 2021-05-06 DIAGNOSIS — Z3A40 40 weeks gestation of pregnancy: Secondary | ICD-10-CM

## 2021-05-06 DIAGNOSIS — Z01812 Encounter for preprocedural laboratory examination: Secondary | ICD-10-CM | POA: Insufficient documentation

## 2021-05-06 DIAGNOSIS — Z20822 Contact with and (suspected) exposure to covid-19: Secondary | ICD-10-CM | POA: Insufficient documentation

## 2021-05-06 DIAGNOSIS — Z3403 Encounter for supervision of normal first pregnancy, third trimester: Secondary | ICD-10-CM

## 2021-05-06 LAB — POCT URINALYSIS DIPSTICK OB
Bilirubin, UA: NEGATIVE
Blood, UA: NEGATIVE
Glucose, UA: NEGATIVE
Ketones, UA: NEGATIVE
Leukocytes, UA: NEGATIVE
Nitrite, UA: NEGATIVE
POC,PROTEIN,UA: NEGATIVE
Spec Grav, UA: 1.01 (ref 1.010–1.025)
Urobilinogen, UA: 0.2 E.U./dL
pH, UA: 6.5 (ref 5.0–8.0)

## 2021-05-06 LAB — SARS CORONAVIRUS 2 (TAT 6-24 HRS): SARS Coronavirus 2: NEGATIVE

## 2021-05-06 NOTE — Progress Notes (Signed)
NST Reactive  Baseline 120 Moderate variability Accelerations present Decelerations present  Contractions: occasional.   ROB as scheduled with MD.    Doreene Burke, CNM

## 2021-05-06 NOTE — Patient Instructions (Signed)
[employer]'s Emergency Medicine: Concepts and Clinical Practice (9th ed., pp. 2296- 2312). Elsevier.">  Braxton Hicks Contractions  Contractions of the uterus can occur throughout pregnancy, but they are not always a sign that you are in labor. You may have practice contractions called Braxton Hicks contractions. These false labor contractions are sometimesconfused with true labor. What are Braxton Hicks contractions? Braxton Hicks contractions are tightening movements that occur in the muscles of the uterus before labor. Unlike true labor contractions, these contractions do not result in opening (dilation) and thinning of the lowest part of the uterus (cervix). Toward the end of pregnancy (32-34 weeks), Braxton Hicks contractions can happen more often and may become stronger. These contractions are sometimesdifficult to tell apart from true labor because they can be very uncomfortable. How to tell the difference between true labor and false labor True labor Contractions last 30-70 seconds. Contractions become very regular. Discomfort is usually felt in the top of the uterus, and it spreads to the lower abdomen and low back. Contractions do not go away with walking. Contractions usually become stronger and more frequent. The cervix dilates and gets thinner. False labor Contractions are usually shorter, weaker, and farther apart than true labor contractions. Contractions are usually irregular. Contractions are often felt in the front of the lower abdomen and in the groin. Contractions may go away when you walk around or change positions while lying down. The cervix usually does not dilate or become thin. Sometimes, the only way to tell if you are in true labor is for your healthcare provider to look for changes in your cervix. Your health care provider will do a physical exam and may monitor your contractions. If you are in true labor, your health care provider will send youhome with instructions  about when to return to the hospital. You may continue to have Braxton Hicks contractions until you go into truelabor. Follow these instructions at home:  Take over-the-counter and prescription medicines only as told by your health care provider. If Braxton Hicks contractions are making you uncomfortable: Change your position from lying down or resting to walking, or change from walking to resting. Sit and rest in a tub of warm water. Drink enough fluid to keep your urine pale yellow. Dehydration may cause these contractions. Do slow and deep breathing several times an hour. Keep all follow-up visits. This is important. Contact a health care provider if: You have a fever. You have continuous pain in your abdomen. Your contractions become stronger, more regular, and closer together. You pass blood-tinged mucus. Get help right away if: You have fluid leaking or gushing from your vagina. You have bright red blood coming from your vagina. Your baby is not moving inside you as much as it used to. Summary You may have practice contractions called Braxton Hicks contractions. These false labor contractions are sometimes confused with true labor. Braxton Hicks contractions are usually shorter, weaker, farther apart, and less regular than true labor contractions. True labor contractions usually become stronger, more regular, and more frequent. Manage discomfort from Braxton Hicks contractions by changing position, resting in a warm bath, practicing deep breathing, and drinking plenty of water. Keep all follow-up visits. Contact your health care provider if your contractions become stronger, more regular, and closer together. This information is not intended to replace advice given to you by your health care provider. Make sure you discuss any questions you have with your healthcare provider. Document Revised: 08/06/2020 Document Reviewed: 08/06/2020 Elsevier Patient Education  2022 Elsevier    Inc.  

## 2021-05-07 ENCOUNTER — Encounter: Payer: Self-pay | Admitting: Surgical

## 2021-05-07 ENCOUNTER — Telehealth: Payer: Self-pay | Admitting: Obstetrics and Gynecology

## 2021-05-07 NOTE — Telephone Encounter (Signed)
Spoke with patient and let her know that she is scheduled for induction at 5 AM tomorrow. Patient verbalized understand.

## 2021-05-07 NOTE — Telephone Encounter (Signed)
Pt called asking about plan of care- she is overdue- had nst yesterday. Pt is wondering if she will be induced so she can plan accordingly. Mad ept aware in clinic and would get a call back. Please advise.

## 2021-05-08 ENCOUNTER — Inpatient Hospital Stay
Admission: EM | Admit: 2021-05-08 | Discharge: 2021-05-09 | DRG: 807 | Disposition: A | Discharge status: Home / Self Care | Payer: BC Managed Care – PPO | Attending: Obstetrics and Gynecology | Admitting: Obstetrics and Gynecology

## 2021-05-08 ENCOUNTER — Inpatient Hospital Stay: Payer: BC Managed Care – PPO | Admitting: Certified Registered"

## 2021-05-08 ENCOUNTER — Encounter: Payer: Self-pay | Admitting: Obstetrics and Gynecology

## 2021-05-08 ENCOUNTER — Other Ambulatory Visit: Payer: Self-pay

## 2021-05-08 DIAGNOSIS — Z3A40 40 weeks gestation of pregnancy: Secondary | ICD-10-CM | POA: Diagnosis not present

## 2021-05-08 DIAGNOSIS — Z20822 Contact with and (suspected) exposure to covid-19: Secondary | ICD-10-CM | POA: Diagnosis present

## 2021-05-08 DIAGNOSIS — O9902 Anemia complicating childbirth: Secondary | ICD-10-CM | POA: Diagnosis present

## 2021-05-08 DIAGNOSIS — O480 Post-term pregnancy: Secondary | ICD-10-CM | POA: Diagnosis present

## 2021-05-08 LAB — TYPE AND SCREEN
ABO/RH(D): A POS
Antibody Screen: NEGATIVE

## 2021-05-08 LAB — CBC
HCT: 32.2 % — ABNORMAL LOW (ref 36.0–46.0)
Hemoglobin: 10.9 g/dL — ABNORMAL LOW (ref 12.0–15.0)
MCH: 30 pg (ref 26.0–34.0)
MCHC: 33.9 g/dL (ref 30.0–36.0)
MCV: 88.7 fL (ref 80.0–100.0)
Platelets: 217 10*3/uL (ref 150–400)
RBC: 3.63 MIL/uL — ABNORMAL LOW (ref 3.87–5.11)
RDW: 13.5 % (ref 11.5–15.5)
WBC: 8.9 10*3/uL (ref 4.0–10.5)
nRBC: 0 % (ref 0.0–0.2)

## 2021-05-08 LAB — RPR: RPR Ser Ql: NONREACTIVE

## 2021-05-08 MED ORDER — EPHEDRINE 5 MG/ML INJ
10.0000 mg | INTRAVENOUS | Status: DC | PRN
  Filled 2021-05-08: qty 2

## 2021-05-08 MED ORDER — SOD CITRATE-CITRIC ACID 500-334 MG/5ML PO SOLN: 30.0000 mL | ORAL | Status: DC | PRN

## 2021-05-08 MED ORDER — DIPHENHYDRAMINE HCL 50 MG/ML IJ SOLN
12.5000 mg | INTRAMUSCULAR | Status: DC | PRN
Start: 2021-05-08 — End: 2021-05-09

## 2021-05-08 MED ORDER — BUPIVACAINE HCL (PF) 0.25 % IJ SOLN
INTRAMUSCULAR | Status: DC | PRN
  Administered 2021-05-08: 4 mL via EPIDURAL

## 2021-05-08 MED ORDER — MISOPROSTOL 200 MCG PO TABS
ORAL_TABLET | ORAL | Status: AC
  Filled 2021-05-08: qty 4

## 2021-05-08 MED ORDER — FENTANYL-BUPIVACAINE-NACL 0.5-0.125-0.9 MG/250ML-% EP SOLN
EPIDURAL | Status: DC | PRN
  Administered 2021-05-08: 10 mL/h via EPIDURAL

## 2021-05-08 MED ORDER — LACTATED RINGERS IV SOLN: 500.0000 mL | INTRAVENOUS | Status: DC | PRN

## 2021-05-08 MED ORDER — OXYTOCIN BOLUS FROM INFUSION
333.0000 mL | Freq: Once | INTRAVENOUS | Status: AC
Start: 2021-05-08 — End: 2021-05-08
  Administered 2021-05-08: 333 mL via INTRAVENOUS

## 2021-05-08 MED ORDER — LIDOCAINE-EPINEPHRINE 2 %-1:100000 IJ SOLN
INTRAMUSCULAR | Status: DC | PRN
  Administered 2021-05-08: 3 mL via PERINEURAL

## 2021-05-08 MED ORDER — OXYTOCIN-SODIUM CHLORIDE 30-0.9 UT/500ML-% IV SOLN
2.5000 [IU]/h | INTRAVENOUS | Status: DC
  Filled 2021-05-08 (×2): qty 500

## 2021-05-08 MED ORDER — AMMONIA AROMATIC IN INHA
RESPIRATORY_TRACT | Status: AC
  Filled 2021-05-08: qty 10

## 2021-05-08 MED ORDER — BUTORPHANOL TARTRATE 1 MG/ML IJ SOLN
1.0000 mg | INTRAMUSCULAR | Status: DC | PRN
  Administered 2021-05-08 (×2): 1 mg via INTRAVENOUS
  Filled 2021-05-08 (×3): qty 1

## 2021-05-08 MED ORDER — SODIUM CHLORIDE 0.9 % IV SOLN: 2.0000 g | Freq: Once | INTRAVENOUS | Status: DC

## 2021-05-08 MED ORDER — PHENYLEPHRINE 40 MCG/ML (10ML) SYRINGE FOR IV PUSH (FOR BLOOD PRESSURE SUPPORT)
80.0000 ug | PREFILLED_SYRINGE | INTRAVENOUS | Status: DC | PRN
  Filled 2021-05-08: qty 10

## 2021-05-08 MED ORDER — FENTANYL-BUPIVACAINE-NACL 0.5-0.125-0.9 MG/250ML-% EP SOLN
EPIDURAL | Status: AC
  Filled 2021-05-08: qty 250

## 2021-05-08 MED ORDER — LIDOCAINE HCL (PF) 1 % IJ SOLN
30.0000 mL | INTRAMUSCULAR | Status: DC | PRN
  Filled 2021-05-08: qty 30

## 2021-05-08 MED ORDER — SODIUM CHLORIDE 0.9 % IV SOLN
1.0000 g | INTRAVENOUS | Status: DC
  Filled 2021-05-08 (×3): qty 1000

## 2021-05-08 MED ORDER — MISOPROSTOL 25 MCG QUARTER TABLET
50.0000 ug | ORAL_TABLET | ORAL | Status: DC | PRN
  Administered 2021-05-08 (×2): 50 ug via VAGINAL
  Filled 2021-05-08: qty 1
  Filled 2021-05-08: qty 2
  Filled 2021-05-08: qty 1

## 2021-05-08 MED ORDER — ACETAMINOPHEN 325 MG PO TABS: 650.0000 mg | ORAL_TABLET | ORAL | Status: DC | PRN

## 2021-05-08 MED ORDER — LACTATED RINGERS IV SOLN: 500.0000 mL | Freq: Once | INTRAVENOUS | Status: DC

## 2021-05-08 MED ORDER — ONDANSETRON HCL 4 MG/2ML IJ SOLN: 4.0000 mg | Freq: Four times a day (QID) | INTRAMUSCULAR | Status: DC | PRN

## 2021-05-08 MED ORDER — OXYTOCIN 10 UNIT/ML IJ SOLN
INTRAMUSCULAR | Status: AC
  Filled 2021-05-08: qty 2

## 2021-05-08 MED ORDER — FENTANYL-BUPIVACAINE-NACL 0.5-0.125-0.9 MG/250ML-% EP SOLN: 12.0000 mL/h | EPIDURAL | Status: DC | PRN

## 2021-05-08 MED ORDER — LACTATED RINGERS IV SOLN: INTRAVENOUS | Status: DC

## 2021-05-08 MED ORDER — LIDOCAINE HCL (PF) 1 % IJ SOLN
INTRAMUSCULAR | Status: DC | PRN
  Administered 2021-05-08: 3 mL

## 2021-05-08 NOTE — Anesthesia Preprocedure Evaluation (Addendum)
Anesthesia Evaluation  Patient identified by MRN, date of birth, ID band Patient awake    Reviewed: Allergy & Precautions, H&P , NPO status , Patient's Chart, lab work & pertinent test results, reviewed documented beta blocker date and time   History of Anesthesia Complications Negative for: history of anesthetic complications  Airway Mallampati: I  TM Distance: >3 FB Neck ROM: full    Dental no notable dental hx. (+) Teeth Intact   Pulmonary neg pulmonary ROS,    Pulmonary exam normal breath sounds clear to auscultation       Cardiovascular Exercise Tolerance: Good negative cardio ROS   Rhythm:regular Rate:Normal     Neuro/Psych Depression negative neurological ROS  negative psych ROS   GI/Hepatic negative GI ROS, Neg liver ROS, GERD  ,  Endo/Other  negative endocrine ROS  Renal/GU negative Renal ROS  negative genitourinary   Musculoskeletal   Abdominal   Peds  Hematology negative hematology ROS (+)   Anesthesia Other Findings   Reproductive/Obstetrics (+) Pregnancy                            Anesthesia Physical Anesthesia Plan  ASA: 2  Anesthesia Plan: Epidural   Post-op Pain Management:    Induction:   PONV Risk Score and Plan:   Airway Management Planned:   Additional Equipment:   Intra-op Plan:   Post-operative Plan:   Informed Consent: I have reviewed the patients History and Physical, chart, labs and discussed the procedure including the risks, benefits and alternatives for the proposed anesthesia with the patient or authorized representative who has indicated his/her understanding and acceptance.       Plan Discussed with: Anesthesiologist and Surgeon  Anesthesia Plan Comments:         Anesthesia Quick Evaluation

## 2021-05-08 NOTE — Progress Notes (Signed)
Intrapartum Progress Note  S: Patient notes she is beginning to feel contraction pain again, received a dose of Stadol ~ 1 hour ago. Reports SROM at ~ 0900.  O: Blood pressure 138/80, pulse (!) 56, temperature 97.7 F (36.5 C), temperature source Oral, resp. rate 17, height 4\' 11"  (1.499 m), weight 65.2 kg, last menstrual period 07/27/2020. Gen App: NAD, mildly uncomfortable Abdomen: soft, gravid FHT: baseline 135 bpm.  Accels present.  Decels absent. moderate in degree variability.   Tocometer: contractions q 1-5 minutes Cervix: 1.5/20-30/-3. SROM Extremities: Nontender, no edema.  Pitocin: None  Labs: No new labs   Assessment:  1: SIUP at [redacted]w[redacted]d 2. SROM  Plan:  1. Continue with IOL, received 2nd dose of Cytotec at approximately 10:30 a.m.  Foley bulb placed.  2. Fetal status reassuring, Category I.  3. SROM'd, GBS negative.    [redacted]w[redacted]d, MD 05/08/2021 1:10 PM

## 2021-05-08 NOTE — H&P (Signed)
Obstetric History and Physical  Samantha Newman is a 35 y.o. G1P0 with IUP at [redacted]w[redacted]d presenting for scheduled IOL for postdates pregnancy. Patient states she has been having  occasional mild contractions,  no  vaginal bleeding, intact membranes, with active fetal movement.    Prenatal Course Source of Care: [facility] with onset of care at 10 weeks Pregnancy complications or risks: Patient Active Problem List   Diagnosis Date Noted   Post-dates pregnancy 05/08/2021   She plans to breastfeed She desires  undecided method  for postpartum contraception.   Prenatal labs and studies: ABO, Rh: --/--/A POS (07/27 0539) Antibody: NEG (07/27 0528) Rubella: 8.57 (12/27 1004) RPR: Non Reactive (05/06 0945)  HBsAg: Negative (12/27 1004)  HIV: Non Reactive (12/27 1004)  JQB:HALPFXTK/-- (06/27 1411) 1 hr Glucola  normal Genetic screening normal (MaterniT21, AFP) Anatomy US normal    Past Medical History:  Diagnosis Date   Depression    during high school   Medical history non-contributory     Past Surgical History:  Procedure Laterality Date   HYSTEROSCOPY N/A 05/04/2020   Procedure: HYSTEROSCOPY;  Surgeon: Schermerhorn, Ihor Austin, MD;  Location: [facility] ORS;  Service: Gynecology;  Laterality: N/A;   IUD REMOVAL N/A 05/04/2020   Procedure: INTRAUTERINE DEVICE (IUD) REMOVAL;  Surgeon: Schermerhorn, Ihor Austin, MD;  Location: [facility] ORS;  Service: Gynecology;  Laterality: N/A;   OPEN REDUCTION INTERNAL FIXATION (ORIF) PROXIMAL PHALANX Right 09/03/2020   Procedure: Right middle finger proximal phalanx fracture open reduction and internal fixation;  Surgeon: Ernest Mallick, MD;  Location: [facility] OR;  Service: Orthopedics;  Laterality: Right;    WISDOM TOOTH EXTRACTION      OB History  Gravida Para Term Preterm AB Living  1            SAB IAB Ectopic Multiple Live Births               # Outcome Date GA Lbr Len/2nd Weight Sex Delivery Anes PTL Lv  1 Current              Social History   Socioeconomic History   Marital status: Married    Spouse name: Thurmond Butts   Number of children: Not on file   Years of education: Not on file   Highest education level: Not on file  Occupational History   Not on file  Tobacco Use   Smoking status: Never   Smokeless tobacco: Never  Vaping Use   Vaping Use: Never used  Substance and Sexual Activity   Alcohol use: Not Currently    Comment: occ    Drug use: Never   Sexual activity: Yes    Partners: Male    Birth control/protection: None    Comment: undecided  Other Topics Concern   Not on file  Social History Narrative   Not on file   Social Determinants of Health   Financial Resource Strain: Not on file  Food Insecurity: Not on file  Transportation Needs: Not on file  Physical Activity: Not on file  Stress: Not on file  Social Connections: Not on file    Family History  Problem Relation Age of Onset   Breast cancer Mother 58   Healthy Father    Healthy Brother    Diabetes Maternal Aunt    Cancer Maternal Uncle    Healthy Paternal Aunt    Lung cancer Maternal Grandmother    Breast cancer Paternal Grandmother    Healthy Brother  Medications Prior to Admission  Medication Sig Dispense Refill Last Dose   acyclovir (ZOVIRAX) 400 MG tablet Take 400 mg by mouth at bedtime. As needed   05/07/2021   ferrous sulfate (FERROUSUL) 325 (65 FE) MG tablet Take 1 tablet (325 mg total) by mouth daily with breakfast. 60 tablet 1 05/07/2021   Prenatal Vit-Fe Fumarate-FA (PRENATAL MULTIVITAMIN) TABS tablet Take 1 tablet by mouth daily at 12 noon.   05/07/2021   docusate sodium (COLACE) 100 MG capsule Take 1 capsule (100 mg total) by mouth daily as needed. 30 capsule 2     No Known Allergies  Review of Systems: Negative except for what is mentioned in HPI.  Physical Exam: BP 133/88 (BP Location: Left Arm)   Pulse (!) 59   Temp 98.3 F (36.8 C) (Oral)   Resp 16   Ht 4\' 11"  (1.499 m)   Wt 65.2 kg   LMP  07/27/2020   BMI 29.03 kg/m  CONSTITUTIONAL: Well-developed, well-nourished female in no acute distress.  HENT:  Normocephalic, atraumatic, External right and left ear normal. Oropharynx is clear and moist EYES: Conjunctivae and EOM are normal. Pupils are equal, round, and reactive to light. No scleral icterus.  NECK: Normal range of motion, supple, no masses SKIN: Skin is warm and dry. No rash noted. Not diaphoretic. No erythema. No pallor. NEUROLOGIC: Alert and oriented to person, place, and time. Normal reflexes, muscle tone coordination. No cranial nerve deficit noted. PSYCHIATRIC: Normal mood and affect. Normal behavior. Normal judgment and thought content. CARDIOVASCULAR: Normal heart rate noted, regular rhythm RESPIRATORY: Effort and breath sounds normal, no problems with respiration noted ABDOMEN: Soft, nontender, nondistended, gravid. MUSCULOSKELETAL: Normal range of motion. No edema and no tenderness. 2+ distal pulses.  Cervical Exam: Dilatation 0.5 cm   Effacement thick   Station -3   Presentation: cephalic FHT:  Baseline rate 130 bpm   Variability moderate  Accelerations present   Decelerations none Contractions: Infrequent    Pertinent Labs/Studies:   Results for orders placed or performed during the hospital encounter of 05/08/21 (from the past 24 hour(s))  CBC     Status: Abnormal   Collection Time: 05/08/21  5:28 AM  Result Value Ref Range   WBC 8.9 4.0 - 10.5 K/uL   RBC 3.63 (L) 3.87 - 5.11 MIL/uL   Hemoglobin 10.9 (L) 12.0 - 15.0 g/dL   HCT 05/10/21 (L) 56.4 - 33.2 %   MCV 88.7 80.0 - 100.0 fL   MCH 30.0 26.0 - 34.0 pg   MCHC 33.9 30.0 - 36.0 g/dL   RDW 95.1 88.4 - 16.6 %   Platelets 217 150 - 400 K/uL   nRBC 0.0 0.0 - 0.2 %  Type and screen     Status: None   Collection Time: 05/08/21  5:28 AM  Result Value Ref Range   ABO/RH(D) A POS    Antibody Screen NEG    Sample Expiration      05/11/2021,2359 Performed at [facility], [address]  [place], [place]     Assessment : Samantha Newman is a 35 y.o. G1P0 at [redacted]w[redacted]d being admitted for induction of labor due to postdates pregnancy.  Plan: Labor: Induction as ordered as per protocol with Cytotec, s/p 1st dose at 0630. Analgesia as needed. FWB: Reassuring fetal heart tracing.  GBS negative Delivery plan: Hopeful for vaginal delivery    [redacted]w[redacted]d, MD [facility]

## 2021-05-08 NOTE — Anesthesia Procedure Notes (Signed)
Epidural Patient location during procedure: OB Start time: 05/08/2021 2:20 PM  Staffing Performed: resident/CRNA   Preanesthetic Checklist Completed: patient identified, IV checked, site marked, risks and benefits discussed, surgical consent, monitors and equipment checked, pre-op evaluation and timeout performed  Epidural Patient position: sitting Prep: ChloraPrep Patient monitoring: blood pressure and heart rate Approach: midline Location: L3-L4 Injection technique: LOR saline  Needle:  Needle type: Hustead  Needle gauge: 17 G Needle length: 9 cm Needle insertion depth: 5.5 cm Catheter type: closed end flexible Catheter size: 19 Gauge Catheter at skin depth: 11 cm Test dose: 2% lidocaine with Epi 1:200 K  Assessment Events: blood not aspirated, injection not painful, no injection resistance, no paresthesia and negative IV test

## 2021-05-08 NOTE — Progress Notes (Signed)
Intrapartum Progress Note  S: Patient has no complaints.   O: Blood pressure 137/69, pulse 70, temperature 98.1 F (36.7 C), temperature source Axillary, resp. rate 17, height 4\' 11"  (1.499 m), weight 65.2 kg, last menstrual period 07/27/2020, SpO2 100 %. Gen App: NAD, comfortable Abdomen: soft, gravid FHT: baseline 140 bpm.  Accels present.  Decels present - occasional early decelerations . Moderate in degree variability.   Tocometer: contractions q 31-3 minutes Cervix: 9.5/100/+1 to +2.  Extremities: Nontender, no edema.  Pitocin: None  Labs: No new labs   Assessment:  1: SIUP at [redacted]w[redacted]d 2. SROM at 0900  Plan:  1. Anticipate vaginal delivery soon 2. Fetal status reassuring, Category I.  3. SROM'd, GBS negative. No signs of chorioamnionitis.   [redacted]w[redacted]d, MD 05/08/2021 6:53 PM

## 2021-05-09 ENCOUNTER — Encounter: Payer: Self-pay | Admitting: Obstetrics and Gynecology

## 2021-05-09 LAB — CBC
HCT: 25.8 % — ABNORMAL LOW (ref 36.0–46.0)
Hemoglobin: 8.8 g/dL — ABNORMAL LOW (ref 12.0–15.0)
MCH: 30.3 pg (ref 26.0–34.0)
MCHC: 34.1 g/dL (ref 30.0–36.0)
MCV: 89 fL (ref 80.0–100.0)
Platelets: 169 10*3/uL (ref 150–400)
RBC: 2.9 MIL/uL — ABNORMAL LOW (ref 3.87–5.11)
RDW: 13.5 % (ref 11.5–15.5)
WBC: 22.3 10*3/uL — ABNORMAL HIGH (ref 4.0–10.5)
nRBC: 0 % (ref 0.0–0.2)

## 2021-05-09 MED ORDER — PRENATAL MULTIVITAMIN CH
1.0000 | ORAL_TABLET | Freq: Every day | ORAL | Status: DC
  Administered 2021-05-09: 1 via ORAL
  Filled 2021-05-09: qty 1

## 2021-05-09 MED ORDER — SIMETHICONE 80 MG PO CHEW: 80.0000 mg | CHEWABLE_TABLET | ORAL | Status: DC | PRN

## 2021-05-09 MED ORDER — ONDANSETRON HCL 4 MG/2ML IJ SOLN: 4.0000 mg | INTRAMUSCULAR | Status: DC | PRN

## 2021-05-09 MED ORDER — WITCH HAZEL-GLYCERIN EX PADS: 1.0000 "application " | MEDICATED_PAD | CUTANEOUS | Status: DC | PRN

## 2021-05-09 MED ORDER — IBUPROFEN 600 MG PO TABS
600.0000 mg | ORAL_TABLET | Freq: Four times a day (QID) | ORAL | Status: DC
  Administered 2021-05-09 (×3): 600 mg via ORAL
  Filled 2021-05-09 (×4): qty 1

## 2021-05-09 MED ORDER — COCONUT OIL OIL: 1.0000 "application " | TOPICAL_OIL | Status: DC | PRN

## 2021-05-09 MED ORDER — DIBUCAINE (PERIANAL) 1 % EX OINT: 1.0000 "application " | TOPICAL_OINTMENT | CUTANEOUS | Status: DC | PRN

## 2021-05-09 MED ORDER — BENZOCAINE-MENTHOL 20-0.5 % EX AERO
1.0000 "application " | INHALATION_SPRAY | CUTANEOUS | Status: DC | PRN
  Administered 2021-05-09: 1 via TOPICAL
  Filled 2021-05-09: qty 56

## 2021-05-09 MED ORDER — SENNOSIDES-DOCUSATE SODIUM 8.6-50 MG PO TABS
2.0000 | ORAL_TABLET | Freq: Every day | ORAL | Status: DC
  Administered 2021-05-09: 2 via ORAL
  Filled 2021-05-09: qty 2

## 2021-05-09 MED ORDER — ONDANSETRON HCL 4 MG PO TABS: 4.0000 mg | ORAL_TABLET | ORAL | Status: DC | PRN

## 2021-05-09 MED ORDER — ZOLPIDEM TARTRATE 5 MG PO TABS: 5.0000 mg | ORAL_TABLET | Freq: Every evening | ORAL | Status: DC | PRN

## 2021-05-09 MED ORDER — IBUPROFEN 600 MG PO TABS: 600.0000 mg | ORAL_TABLET | Freq: Four times a day (QID) | ORAL | 1 refills | Status: DC | PRN

## 2021-05-09 MED ORDER — ACETAMINOPHEN 500 MG PO TABS: 1000.0000 mg | ORAL_TABLET | Freq: Four times a day (QID) | ORAL | Status: DC | PRN

## 2021-05-09 MED ORDER — DIPHENHYDRAMINE HCL 25 MG PO CAPS: 25.0000 mg | ORAL_CAPSULE | Freq: Four times a day (QID) | ORAL | Status: DC | PRN

## 2021-05-09 MED ORDER — FERROUS SULFATE 325 (65 FE) MG PO TABS
325.0000 mg | ORAL_TABLET | Freq: Two times a day (BID) | ORAL | 1 refills | Status: DC
Start: 2021-05-09 — End: 2022-11-18

## 2021-05-09 NOTE — Discharge Summary (Signed)
Postpartum Discharge Summary      Patient Name: Samantha Newman DOB: [birth year] MRN: [id number]  Date of admission: 05/08/2021 Delivery date:05/08/2021  Delivering provider: Rubie Maid  Date of discharge: 05/09/2021  Admitting diagnosis: Post-dates pregnancy [O48.0] Intrauterine pregnancy: [redacted]w[redacted]d    Secondary diagnosis:  Active Problems:   Post-dates pregnancy  Additional problems: Anemia of pregnancy    Discharge diagnosis: Term Pregnancy Delivered and Anemia                                              Post partum procedures: None Augmentation: Cytotec and IP Foley Complications: None  Hospital course: Induction of Labor With Vaginal Delivery   35y.o. yo G1P1001 at 493w5das admitted to the hospital 05/08/2021 for induction of labor.  Indication for induction: Postdates.  Patient had an uncomplicated labor course as follows: Membrane Rupture Time/Date: 8:50 AM ,05/08/2021   Delivery Method:Vaginal, Spontaneous  Episiotomy: None  Lacerations:  1st degree  Details of delivery can be found in separate delivery note.  Patient had a routine postpartum course. Patient is discharged home 05/09/21.  Newborn Data: Birth date:05/08/2021  Birth time:10:42 PM  Gender:Female  Living status:Living  Apgars:6 ,9  Weight:3260 g   Magnesium Sulfate received: No BMZ received: No Rhophylac:No MMR:No T-DaP:Given prenatally Flu: No Transfusion:No  Physical exam  Vitals:   05/09/21 0810 05/09/21 1211 05/09/21 1557 05/09/21 2229  BP: 129/78 (!) 122/99 113/80 125/86  Pulse: 62 99 72 68  Resp: _0 Temp: 97.7 F (36.5 C) 98.3 F (36.8 C) 98.4 F (36.9 C) 98.5 F (36.9 C)  TempSrc: Oral Oral Oral Oral  SpO2: 99%   100%  Weight:      Height:       General: alert and no distress Lochia: appropriate Uterine Fundus: firm Incision: N/A DVT Evaluation: No evidence of DVT seen on physical exam. Negative Homan's sign. No cords or calf tenderness. No significant  calf/ankle edema.   Labs: Lab Results  Component Value Date   WBC 22.3 (H) 05/09/2021   HGB 8.8 (L) 05/09/2021   HCT 25.8 (L) 05/09/2021   MCV 89.0 05/09/2021   PLT 169 05/09/2021   CMP Latest Ref Rng & Units 05/02/2020  Glucose 70 - 99 mg/dL 87  BUN 6 - 20 mg/dL 15  Creatinine 0.44 - 1.00 mg/dL 0.83  Sodium 135 - 145 mmol/L 138  Potassium 3.5 - 5.1 mmol/L 3.4(L)  Chloride 98 - 111 mmol/L 105  CO2 22 - 32 mmol/L 23  Calcium 8.9 - 10.3 mg/dL 9.2   Edinburgh Score: Edinburgh Postnatal Depression Scale Screening Tool 05/09/2021  I have been able to laugh and see the funny side of things. 0  I have looked forward with enjoyment to things. 0  I have blamed myself unnecessarily when things went wrong. 1  I have been anxious or worried for no good reason. 0  I have felt scared or panicky for no good reason. 0  Things have been getting on top of me. 1  I have been so unhappy that I have had difficulty sleeping. 0  I have felt sad or miserable. 0  I have been so unhappy that I have been crying. 0  The thought of harming myself has occurred to me. 0  Edinburgh Postnatal Depression Scale Total 2  After visit meds:  Allergies as of 05/09/2021   No Known Allergies      Medication List     TAKE these medications    acyclovir 400 MG tablet Commonly known as: ZOVIRAX Take 400 mg by mouth at bedtime. As needed   docusate sodium 100 MG capsule Commonly known as: COLACE Take 1 capsule (100 mg total) by mouth daily as needed.   ferrous sulfate 325 (65 FE) MG tablet Commonly known as: FerrouSul Take 1 tablet (325 mg total) by mouth 2 (two) times daily with a meal. What changed: when to take this   ibuprofen 600 MG tablet Commonly known as: ADVIL Take 1 tablet (600 mg total) by mouth every 6 (six) hours as needed.   prenatal multivitamin Tabs tablet Take 1 tablet by mouth daily at 12 noon.         Discharge home in stable condition Infant Feeding: Breast Infant  Disposition:home with mother Discharge instruction: per After Visit Summary and Postpartum booklet. Activity: Advance as tolerated. Pelvic rest for 6 weeks.  Diet: routine diet Anticipated Birth Control: Unsure Postpartum Appointment:6 weeks Additional Postpartum F/U: Postpartum Depression checkup (3 week televisit) Future Appointments:No future appointments. Follow up Visit:  Follow-up Information     Rubie Maid, MD. Schedule an appointment as soon as possible for a visit.   Specialties: Obstetrics and Gynecology, Radiology Why: 3 weeks postpartum (videovisit) 6 week postpartum visit Contact information: [place] [place] [address] [phone]                     05/09/2021 Rubie Maid, MD

## 2021-05-09 NOTE — Progress Notes (Signed)
Post Partum Day # 1, s/p SVD  Subjective: no complaints, up ad lib, voiding, and tolerating PO  Objective: Temp:  [97.7 F (36.5 C)-100.3 F (37.9 C)] 97.7 F (36.5 C) (07/28 0810) Pulse Rate:  [55-157] 62 (07/28 0810) Resp:  [14-18] 18 (07/28 0810) BP: (101-148)/(65-99) 129/78 (07/28 0810) SpO2:  [98 %-100 %] 99 % (07/28 0810)  Physical Exam:  General: alert and no distress  Lungs: clear to auscultation bilaterally Breasts: normal appearance, no masses or tenderness Heart: regular rate and rhythm, S1, S2 normal, no murmur, click, rub or gallop Abdomen: soft, non-tender; bowel sounds normal; no masses,  no organomegaly Pelvis: Lochia: appropriate, Uterine Fundus: firm Extremities: DVT Evaluation: No evidence of DVT seen on physical exam. Negative Homan's sign. No cords or calf tenderness. No significant calf/ankle edema.  Recent Labs    05/08/21 0528 05/09/21 0427  HGB 10.9* 8.8*  HCT 32.2* 25.8*    Assessment/Plan: Doing well postpartum Breastfeeding, Lactation consult,  Contraception: possibly OCPs Anemia of pregnancy, asymptomatic. Will treat with PO iron.  Plan for discharge tomorrow   LOS: 1 day   Hildred Laser, MD [facility] 05/09/2021 9:40 AM

## 2021-05-09 NOTE — Anesthesia Postprocedure Evaluation (Signed)
Anesthesia Post Note  Patient: Samantha Newman  Procedure(s) Performed: AN AD HOC LABOR EPIDURAL  Patient location during evaluation: Mother Baby Anesthesia Type: Epidural Level of consciousness: awake and alert Pain management: pain level controlled Vital Signs Assessment: post-procedure vital signs reviewed and stable Respiratory status: spontaneous breathing, nonlabored ventilation and respiratory function stable Cardiovascular status: stable Postop Assessment: no headache, no backache and epidural receding Anesthetic complications: no   No notable events documented.   Last Vitals:  Vitals:   05/09/21 0353 05/09/21 0451  BP: 101/88 116/65  Pulse: 62 62  Resp: 18 14  Temp: 36.9 C 36.6 C  SpO2: 98% 98%    Last Pain:  Vitals:   05/09/21 0451  TempSrc: Oral  PainSc:                  Elmarie Mainland

## 2021-05-10 NOTE — Progress Notes (Signed)
Patient discharged home with infant. Discharge instructions, prescriptions, and follow-up appointment were given to and reviewed with patient. Patient verbalized understanding. Escorted out by auxiliary.  

## 2021-05-30 ENCOUNTER — Other Ambulatory Visit: Payer: Self-pay

## 2021-05-30 ENCOUNTER — Ambulatory Visit (INDEPENDENT_AMBULATORY_CARE_PROVIDER_SITE_OTHER): Payer: BC Managed Care – PPO | Admitting: Obstetrics and Gynecology

## 2021-05-30 ENCOUNTER — Encounter: Payer: Self-pay | Admitting: Obstetrics and Gynecology

## 2021-05-30 DIAGNOSIS — N644 Mastodynia: Secondary | ICD-10-CM | POA: Diagnosis not present

## 2021-05-30 NOTE — Progress Notes (Signed)
Patient complained of lightening pain in both breast. Pain starts at nipple to the breast.   Silvano Bilis, LPN [facility]

## 2021-05-30 NOTE — Progress Notes (Signed)
Virtual Visit via Telephone Note  I connected with Samantha Newman on 05/30/21 at  9:45 AM EDT by telephone and verified that I am speaking with the correct person using two identifiers.  Location: Patient: Home Provider: Office   I discussed the limitations, risks, security and privacy concerns of performing an evaluation and management service by telephone and the availability of in person appointments. I also discussed with the patient that there may be a patient responsible charge related to this service. The patient expressed understanding and agreed to proceed.   History of Present Illness: Samantha Newman is a 35 y.o. G74P1001 female who presents for 2 week postpartum check.  She is s/p NSVD at term  (40.5 weeks). She is currently breastfeeding.  Breastfeeding is going fairly well, is noting some pain occasional with breastfeeding, but denies redness or tenderness. She reports her mood is well. EDPS screen is negative, score is 2. Has good support at home.  Is unsure of contraception desires.     Observations/Objective:  Height 4\' 11"  (1.499 m), weight 126 lb (57.2 kg), currently breastfeeding.  Assessment and Plan: Postpartum state, s/p vaginal delivery Lactating mother   Follow Up Instructions: Patient to f/u in 4 weeks for final postpartum visit.    I discussed the assessment and treatment plan with the patient. The patient was provided an opportunity to ask questions and all were answered. The patient agreed with the plan and demonstrated an understanding of the instructions.   The patient was advised to call back or seek an in-person evaluation if the symptoms worsen or if the condition fails to improve as anticipated.  I provided 10 minutes of non-face-to-face time during this encounter.   , MD [facility]

## 2021-06-24 NOTE — Progress Notes (Signed)
   OBSTETRICS POSTPARTUM CLINIC PROGRESS NOTE  Subjective:     Samantha Newman is a 35 y.o. G1P1001 adult who presents for a postpartum visit. She is 6 week postpartum following a spontaneous vaginal delivery. I have fully reviewed the prenatal and intrapartum course. The delivery was at 41 gestational weeks.  Anesthesia: epidural. Postpartum course has been uncomplicated. Baby's course has been uncomplicated. Baby is feeding by both breast and bottle - Similac with Iron. Bleeding: patient has not resumed menses, with No LMP recorded. Bowel function is normal. Bladder function is abnormal: she reports incontinence (cannot qualify what type) . Does Kegels but is not consistent.  Patient is not sexually active. Contraception method desired is  unknown at this time, plans on having a second child. . Postpartum depression screening: negative.  EDPS score is 1.    The following portions of the patient's history were reviewed and updated as appropriate: allergies, current medications, past family history, past medical history, past social history, past surgical history, and problem list.  Review of Systems A comprehensive review of systems was negative except for: Integument/breast: positive for history of mastitis (thrush) several weeks ago, just completed course of Diflucan last week however still notes persistent symptoms (nipple pain, redness, breast tenderness).    Objective:    BP 123/75   Pulse 80   Resp 16   Ht 4\' 11"  (1.499 m)   Wt 126 lb (57.2 kg)   LMP  (LMP Unknown)   Breastfeeding Yes   BMI 25.45 kg/m   General:  alert and no distress   Breasts:  inspection negative, no nipple discharge, small amount of milk expressed from nipples bilaterally, no purulent discharge. Mildly tender breasts and nipples bilaterally, mild erythema at nipples. No bleeding, no masses or nodularity palpable  Lungs: clear to auscultation bilaterally  Heart:  regular rate and rhythm, S1, S2 normal, no  murmur, click, rub or gallop  Abdomen: soft, non-tender; bowel sounds normal; no masses,  no organomegaly.    Vulva:  normal  Vagina: normal vagina, no discharge, exudate, lesion, or erythema  Cervix:  no cervical motion tenderness and no lesions  Corpus: normal size, contour, position, consistency, mobility, non-tender  Adnexa:  normal adnexa and no mass, fullness, tenderness  Rectal Exam: Not performed.         Labs:  Lab Results  Component Value Date   HGB 8.8 (L) 05/09/2021    Assessment:   1. Postpartum care following vaginal delivery   2. Postpartum anemia   3. Pelvic floor weakness in female   4. Other urinary incontinence   5. Nonpurulent mastitis associated with childbirth, postpartum     Plan:   1. Contraception: none 2. Will check Hgb for h/o postpartum anemia of less than 10.  3. Referral made to Pelvic Floor Physical Therapy for incontinence and pelvic floor weakness. Also discussed bladder behavioral modifications (timed voiding, avoidance of bladder irritants).  4. Postpartum mastitis (thrush) s/p treatment of mom and baby. Still noting persistent symptoms. Will treat with another round Diflucan, encouraged treatment of infant as well. Also reiterated hand hygiene and bottle cleaning.  Lastly, can f/u with lactation consultant if still no improvement.  Follow up in: 6 months for annual exam with OB or PCP.    05/11/2021, MD [facility]

## 2021-06-25 ENCOUNTER — Other Ambulatory Visit: Payer: Self-pay

## 2021-06-25 ENCOUNTER — Encounter: Payer: Self-pay | Admitting: Obstetrics and Gynecology

## 2021-06-25 ENCOUNTER — Ambulatory Visit (INDEPENDENT_AMBULATORY_CARE_PROVIDER_SITE_OTHER): Payer: BC Managed Care – PPO | Admitting: Obstetrics and Gynecology

## 2021-06-25 DIAGNOSIS — N39498 Other specified urinary incontinence: Secondary | ICD-10-CM

## 2021-06-25 DIAGNOSIS — O9081 Anemia of the puerperium: Secondary | ICD-10-CM

## 2021-06-25 DIAGNOSIS — O9122 Nonpurulent mastitis associated with the puerperium: Secondary | ICD-10-CM

## 2021-06-25 DIAGNOSIS — N8189 Other female genital prolapse: Secondary | ICD-10-CM

## 2021-06-25 MED ORDER — FLUCONAZOLE 200 MG PO TABS
200.0000 mg | ORAL_TABLET | Freq: Every day | ORAL | 1 refills | Status: DC
Start: 2021-06-25 — End: 2022-11-18

## 2021-06-25 NOTE — Patient Instructions (Signed)

## 2021-06-26 LAB — HEMOGLOBIN AND HEMATOCRIT, BLOOD
Hematocrit: 38.2 % (ref 34.0–46.6)
Hemoglobin: 12.2 g/dL (ref 11.1–15.9)

## 2021-12-27 LAB — FETAL NONSTRESS TEST

## 2022-07-18 ENCOUNTER — Encounter: Payer: Self-pay | Admitting: Certified Nurse Midwife

## 2022-08-14 ENCOUNTER — Encounter: Payer: Self-pay | Admitting: Obstetrics and Gynecology

## 2022-08-14 ENCOUNTER — Other Ambulatory Visit (HOSPITAL_COMMUNITY)
Admission: RE | Admit: 2022-08-14 | Discharge: 2022-08-14 | Disposition: A | Discharge status: Home / Self Care | Payer: BC Managed Care – PPO | Source: Ambulatory Visit | Attending: Obstetrics and Gynecology | Admitting: Obstetrics and Gynecology

## 2022-08-14 ENCOUNTER — Ambulatory Visit (INDEPENDENT_AMBULATORY_CARE_PROVIDER_SITE_OTHER): Payer: BC Managed Care – PPO | Admitting: Obstetrics and Gynecology

## 2022-08-14 VITALS — BP 130/85 | HR 82 | Ht 59.0 in | Wt 133.5 lb

## 2022-08-14 DIAGNOSIS — Z124 Encounter for screening for malignant neoplasm of cervix: Secondary | ICD-10-CM | POA: Diagnosis present

## 2022-08-14 DIAGNOSIS — Z01419 Encounter for gynecological examination (general) (routine) without abnormal findings: Secondary | ICD-10-CM

## 2022-08-14 NOTE — Progress Notes (Signed)
HPI:      [name] is a 36 y.o. G1P1001 who LMP was Patient's last menstrual period was 08/12/2022.  Subjective:   She presents today for her annual examination.  She is generally doing well.  She reports monthly menses.  She and her husband have been attempting pregnancy for approximately a year without results of pregnancy.  She is using an app on her phone to track her menstrual cycle and to time intercourse.  She reports that she had no difficulty achieving pregnancy last time.    Hx: The following portions of the patient's history were reviewed and updated as appropriate:             She  has a past medical history of Depression and Medical history non-contributory. She does not have any pertinent problems on file. She  has a past surgical history that includes Wisdom tooth extraction; IUD removal (N/A, 05/04/2020); Hysteroscopy (N/A, 05/04/2020); and Open reduction internal fixation (orif) proximal phalanx (Right, 09/03/2020). Her family history includes Breast cancer in her paternal grandmother; Breast cancer (age of onset: 56) in her mother; Cancer in her maternal uncle; Diabetes in her maternal aunt; Healthy in her brother, brother, father, and paternal aunt; Lung cancer in her maternal grandmother. She  reports that she has never smoked. She has never used smokeless tobacco. She reports that she does not currently use alcohol. She reports that she does not use drugs. She has a current medication list which includes the following prescription(s): acyclovir, ibuprofen, prenatal multivitamin, docusate sodium, ferrous sulfate, and fluconazole. She has No Known Allergies.       Review of Systems:  Review of Systems  Constitutional: Denied constitutional symptoms, night sweats, recent illness, fatigue, fever, insomnia and weight loss.  Eyes: Denied eye symptoms, eye pain, photophobia, vision change and visual disturbance.  Ears/Nose/Throat/Neck: Denied ear, nose, throat or neck  symptoms, hearing loss, nasal discharge, sinus congestion and sore throat.  Cardiovascular: Denied cardiovascular symptoms, arrhythmia, chest pain/pressure, edema, exercise intolerance, orthopnea and palpitations.  Respiratory: Denied pulmonary symptoms, asthma, pleuritic pain, productive sputum, cough, dyspnea and wheezing.  Gastrointestinal: Denied, gastro-esophageal reflux, melena, nausea and vomiting.  Genitourinary: Denied genitourinary symptoms including symptomatic vaginal discharge, pelvic relaxation issues, and urinary complaints.  Musculoskeletal: Denied musculoskeletal symptoms, stiffness, swelling, muscle weakness and myalgia.  Dermatologic: Denied dermatology symptoms, rash and scar.  Neurologic: Denied neurology symptoms, dizziness, headache, neck pain and syncope.  Psychiatric: Denied psychiatric symptoms, anxiety and depression.  Endocrine: Denied endocrine symptoms including hot flashes and night sweats.   Meds:   Current Outpatient Medications on File Prior to Visit  Medication Sig Dispense Refill   acyclovir (ZOVIRAX) 400 MG tablet Take 400 mg by mouth at bedtime. As needed     ibuprofen (ADVIL) 600 MG tablet Take 1 tablet (600 mg total) by mouth every 6 (six) hours as needed. 60 tablet 1   Prenatal Vit-Fe Fumarate-FA (PRENATAL MULTIVITAMIN) TABS tablet Take 1 tablet by mouth daily at 12 noon.     docusate sodium (COLACE) 100 MG capsule Take 1 capsule (100 mg total) by mouth daily as needed. (Patient not taking: Reported on 08/14/2022) 30 capsule 2   ferrous sulfate (FERROUSUL) 325 (65 FE) MG tablet Take 1 tablet (325 mg total) by mouth 2 (two) times daily with a meal. (Patient not taking: Reported on 08/14/2022) 60 tablet 1   fluconazole (DIFLUCAN) 200 MG tablet Take 1 tablet (200 mg total) by mouth daily. Take two tablets on first day, then  one tablet daily to complete 14 day regimen (Patient not taking: Reported on 08/14/2022) 15 tablet 1   No current facility-administered  medications on file prior to visit.     Objective:     Vitals:   08/14/22 0820  BP: 130/85  Pulse: 82    Filed Weights   08/14/22 0820  Weight: 133 lb 8 oz (60.6 kg)              Physical examination General NAD, Conversant  HEENT Atraumatic; Op clear with mmm.  Normo-cephalic. Pupils reactive. Anicteric sclerae  Thyroid/Neck Smooth without nodularity or enlargement. Normal ROM.  Neck Supple.  Skin No rashes, lesions or ulceration. Normal palpated skin turgor. No nodularity.  Breasts: No masses or discharge.  Symmetric.  No axillary adenopathy.  Lungs: Clear to auscultation.No rales or wheezes. Normal Respiratory effort, no retractions.  Heart: NSR.  No murmurs or rubs appreciated. No periferal edema  Abdomen: Soft.  Non-tender.  No masses.  No HSM. No hernia  Extremities: Moves all appropriately.  Normal ROM for age. No lymphadenopathy.  Neuro: Oriented to PPT.  Normal mood. Normal affect.     Pelvic:   Vulva: Normal appearance.  No lesions.  Vagina: No lesions or abnormalities noted.  Support: Normal pelvic support.  Urethra No masses tenderness or scarring.  Meatus Normal size without lesions or prolapse.  Cervix: Normal appearance.  No lesions.  Anus: Normal exam.  No lesions.  Perineum: Normal exam.  No lesions.        Bimanual   Uterus: Normal size.  Non-tender.  Mobile.  AV.  Adnexae: No masses.  Non-tender to palpation.  Cul-de-sac: Negative for abnormality.     Assessment:    G1P1001 Patient Active Problem List   Diagnosis Date Noted   Post-dates pregnancy 05/08/2021     1. Well woman exam with routine gynecological exam   2. Cervical cancer screening        Plan:            1.  Basic Screening Recommendations The basic screening recommendations for asymptomatic women were discussed with the patient during her visit.  The age-appropriate recommendations were discussed with her and the rational for the tests reviewed.  When I am informed by the  patient that another primary care physician has previously obtained the age-appropriate tests and they are up-to-date, only outstanding tests are ordered and referrals given as necessary.  Abnormal results of tests will be discussed with her when all of her results are completed.  Routine preventative health maintenance measures emphasized: Exercise/Diet/Weight control, Tobacco Warnings, Alcohol/Substance use risks and Stress Management 2.  Continue prenatal vitamins 3.  We have discussed infertility in some detail.  I have advised use of ovulation predictor kits at this time.  If she is not pregnant in the next 3 months consider hormonal work-up and semen analysis.  I think very likely she will become pregnant when using the kits. Orders No orders of the defined types were placed in this encounter.   No orders of the defined types were placed in this encounter.         F/U  No follow-ups on file.  Finis Bud, M.D. 08/14/2022 8:54 AM

## 2022-08-14 NOTE — Progress Notes (Signed)
Patients presents for annual exam today. She states she has been having slightly irregular cycles, reports sometimes they come 3 days early or late. She also states she would like to discuss becoming pregnant in the next year. Patient is due for pap smear, ordered.  Annual labs declined, done by PCP. She states no other questions or concerns at this time.

## 2022-08-15 LAB — CYTOLOGY - PAP
Comment: NEGATIVE
Diagnosis: NEGATIVE
High risk HPV: NEGATIVE

## 2022-11-18 ENCOUNTER — Encounter: Payer: Self-pay | Admitting: Obstetrics and Gynecology

## 2022-11-18 ENCOUNTER — Ambulatory Visit (INDEPENDENT_AMBULATORY_CARE_PROVIDER_SITE_OTHER): Payer: BC Managed Care – PPO | Admitting: Obstetrics and Gynecology

## 2022-11-18 VITALS — BP 132/84 | HR 87 | Ht 59.0 in | Wt 136.5 lb

## 2022-11-18 DIAGNOSIS — Z3169 Encounter for other general counseling and advice on procreation: Secondary | ICD-10-CM

## 2022-11-18 NOTE — Progress Notes (Signed)
Patient presents today to discuss conception counseling. She states over the past 3 months she has continued to have regular cycles and have used ovulation predictor kits. She states no issues becoming pregnant with her first child. No additional concerns.

## 2022-11-18 NOTE — Progress Notes (Signed)
HPI:      [name] is a 37 y.o. G1P1001 who LMP was Patient's last menstrual period was 11/03/2022.  Subjective:   She presents today for follow-up of infertility.  She and her partner have been trying for more than a year without resultant pregnancy.  She has specifically been trying the last 4 months using ovulation predictor kits.  She reports that she is ovulating usually day 11-13.  She is having appropriately timed intercourse. She and her partner got pregnant rapidly with her last pregnancy.  Her youngest child is 29 months.    Hx: The following portions of the patient's history were reviewed and updated as appropriate:             She  has a past medical history of Depression and Medical history non-contributory. She does not have any pertinent problems on file. She  has a past surgical history that includes Wisdom tooth extraction; IUD removal (N/A, 05/04/2020); Hysteroscopy (N/A, 05/04/2020); and Open reduction internal fixation (orif) proximal phalanx (Right, 09/03/2020). Her family history includes Breast cancer in her paternal grandmother; Breast cancer (age of onset: 67) in her mother; Cancer in her maternal uncle; Diabetes in her maternal aunt; Healthy in her brother, brother, father, and paternal aunt; Lung cancer in her maternal grandmother. She  reports that she has never smoked. She has never used smokeless tobacco. She reports current alcohol use. She reports that she does not use drugs. She has a current medication list which includes the following prescription(s): ibuprofen, prenatal multivitamin, and acyclovir. She has No Known Allergies.       Review of Systems:  Review of Systems  Constitutional: Denied constitutional symptoms, night sweats, recent illness, fatigue, fever, insomnia and weight loss.  Eyes: Denied eye symptoms, eye pain, photophobia, vision change and visual disturbance.  Ears/Nose/Throat/Neck: Denied ear, nose, throat or neck symptoms,  hearing loss, nasal discharge, sinus congestion and sore throat.  Cardiovascular: Denied cardiovascular symptoms, arrhythmia, chest pain/pressure, edema, exercise intolerance, orthopnea and palpitations.  Respiratory: Denied pulmonary symptoms, asthma, pleuritic pain, productive sputum, cough, dyspnea and wheezing.  Gastrointestinal: Denied, gastro-esophageal reflux, melena, nausea and vomiting.  Genitourinary: Denied genitourinary symptoms including symptomatic vaginal discharge, pelvic relaxation issues, and urinary complaints.  Musculoskeletal: Denied musculoskeletal symptoms, stiffness, swelling, muscle weakness and myalgia.  Dermatologic: Denied dermatology symptoms, rash and scar.  Neurologic: Denied neurology symptoms, dizziness, headache, neck pain and syncope.  Psychiatric: Denied psychiatric symptoms, anxiety and depression.  Endocrine: Denied endocrine symptoms including hot flashes and night sweats.   Meds:   Current Outpatient Medications on File Prior to Visit  Medication Sig Dispense Refill   ibuprofen (ADVIL) 600 MG tablet Take 1 tablet (600 mg total) by mouth every 6 (six) hours as needed. 60 tablet 1   Prenatal Vit-Fe Fumarate-FA (PRENATAL MULTIVITAMIN) TABS tablet Take 1 tablet by mouth daily at 12 noon.     acyclovir (ZOVIRAX) 400 MG tablet Take 400 mg by mouth at bedtime. As needed     No current facility-administered medications on file prior to visit.      Objective:     Vitals:   11/18/22 1426 11/18/22 1433  BP: (!) 141/91 132/84  Pulse: 73 87   Filed Weights   11/18/22 1426  Weight: 136 lb 8 oz (61.9 kg)                        Assessment:    G1P1001 Patient Active Problem List  Diagnosis Date Noted   Post-dates pregnancy 05/08/2021     1. 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   Plan:            1.  Hormonal testing -ovarian reserve test  2.   Continue ovulation predictor kits and timed intercourse  3.  Semen analysis!  4.  If all proves normal strongly consider HSG prior to referral to infertility. Orders Orders Placed This Encounter  Procedures   Anti mullerian hormone   DHEA-sulfate   FSH/LH   Glucose, fasting   Insulin, random   Prolactin   Testosterone, Free, Total, SHBG   TSH    No orders of the defined types were placed in this encounter.     F/U  Return in about 4 weeks (around 12/16/2022). I spent 23 minutes involved in the care of this patient preparing to see the patient by obtaining and reviewing her medical history (including labs, imaging tests and prior procedures), documenting clinical information in the electronic health record (EHR), counseling and coordinating care plans, writing and sending prescriptions, ordering tests or procedures and in direct communicating with the patient and medical staff discussing pertinent items from her history and physical exam.  Finis Bud, M.D. 11/18/2022 3:03 PM

## 2022-11-19 ENCOUNTER — Other Ambulatory Visit: Payer: BC Managed Care – PPO

## 2022-11-23 IMAGING — US US OB COMP +14 WK
1 series · 13 of 28 positions shown · non-contrast
Comparison: none

CLINICAL DATA: Second trimester pregnancy for fetal anatomy survey.

EXAM:
OBSTETRICAL ULTRASOUND >14 WKS

[Series 1: us ob comp + 14 wk · 13 of 96 slices shown]
[im 4/96]
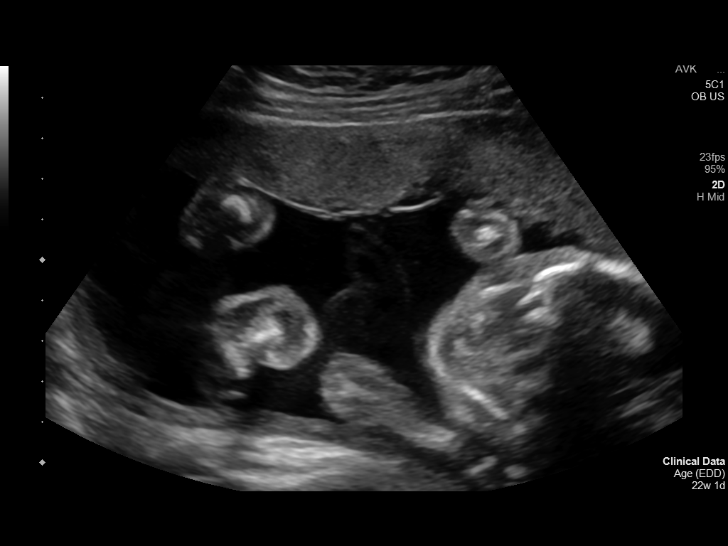
[im 11/96]
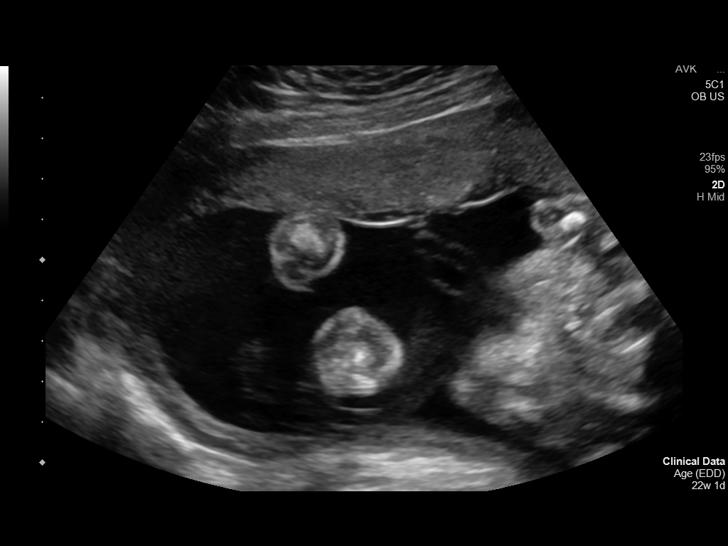
[im 18/96]
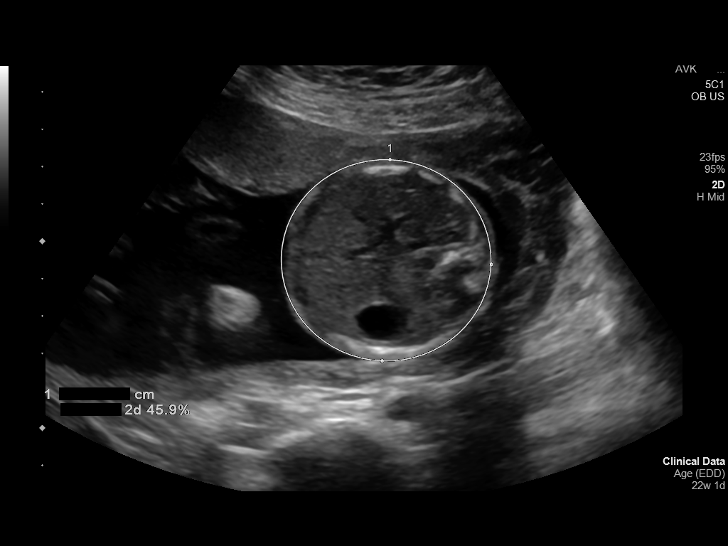
[im 25/96]
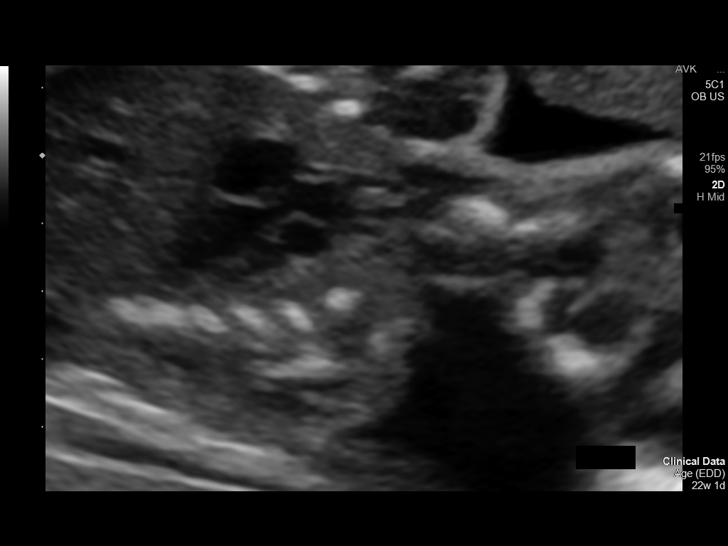
[im 32/96]
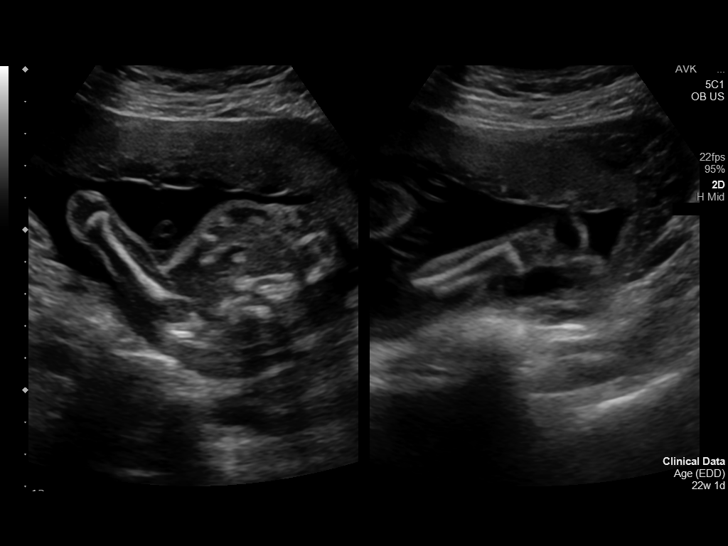
[im 39/96]
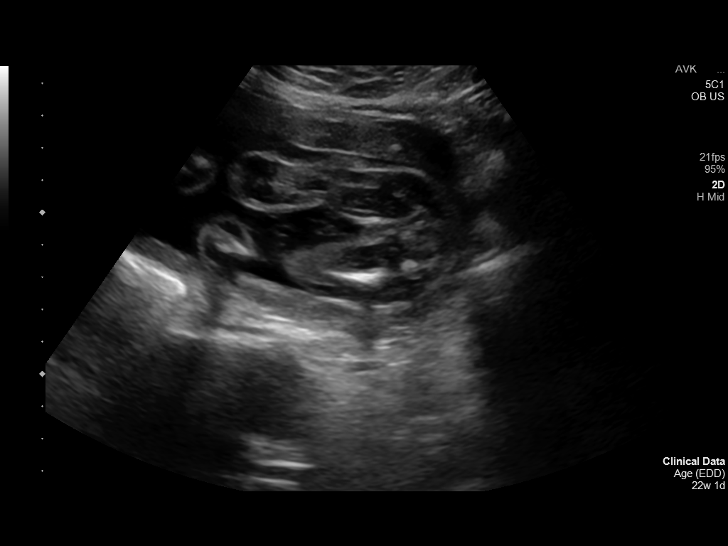
[im 50/96]
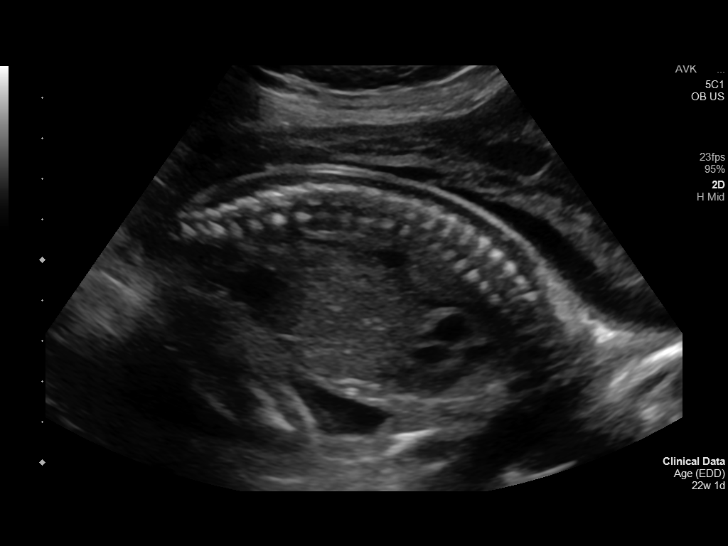
[im 57/96]
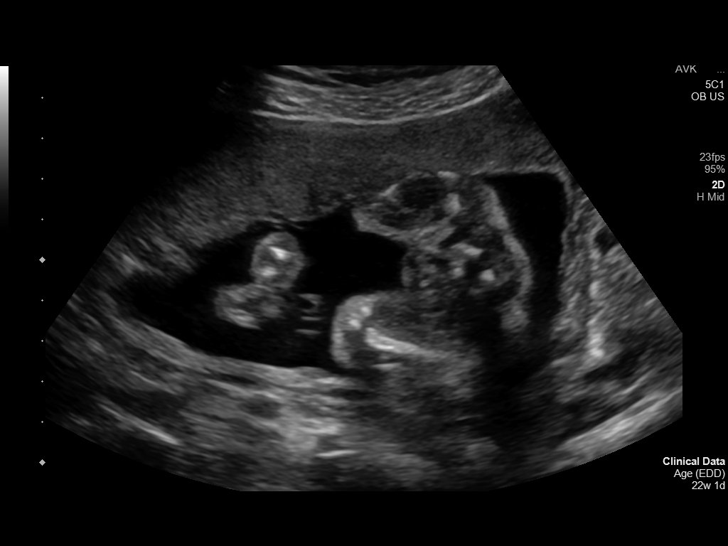
[im 64/96]
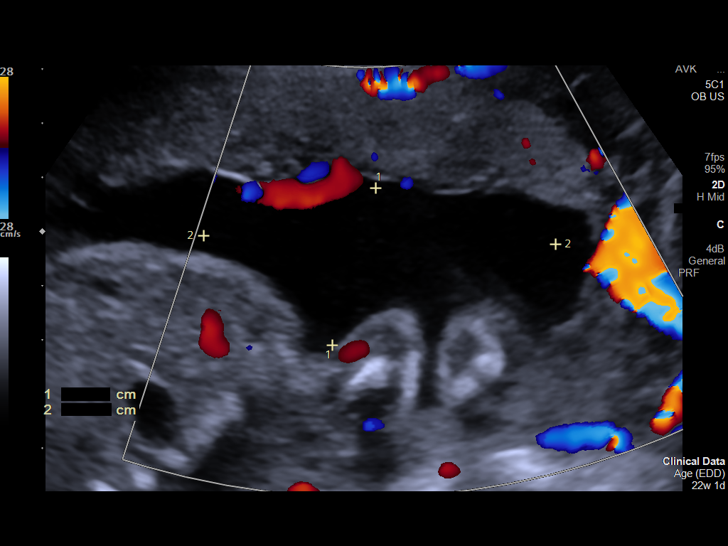
[im 71/96]
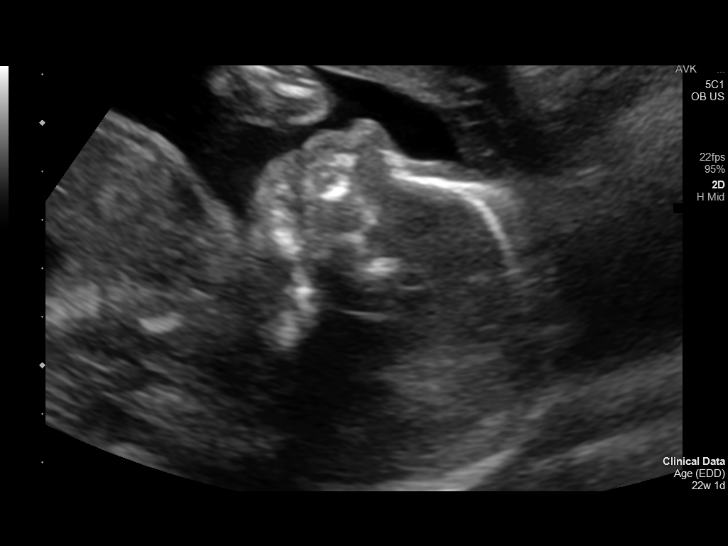
[im 78/96]
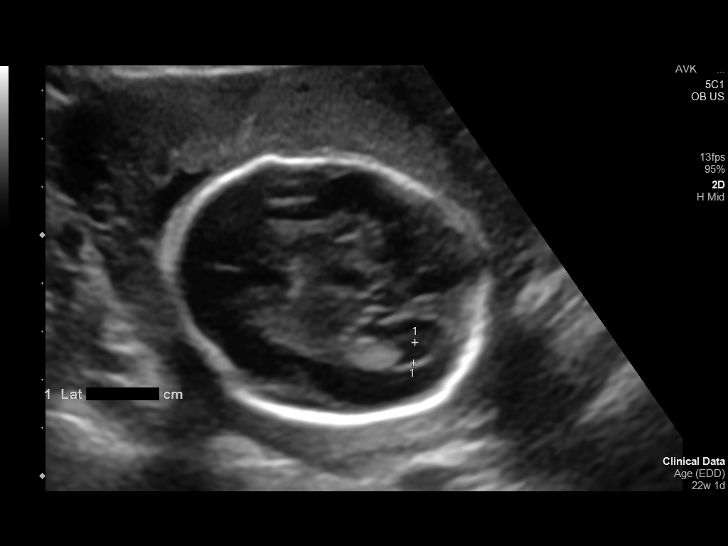
[im 85/96]
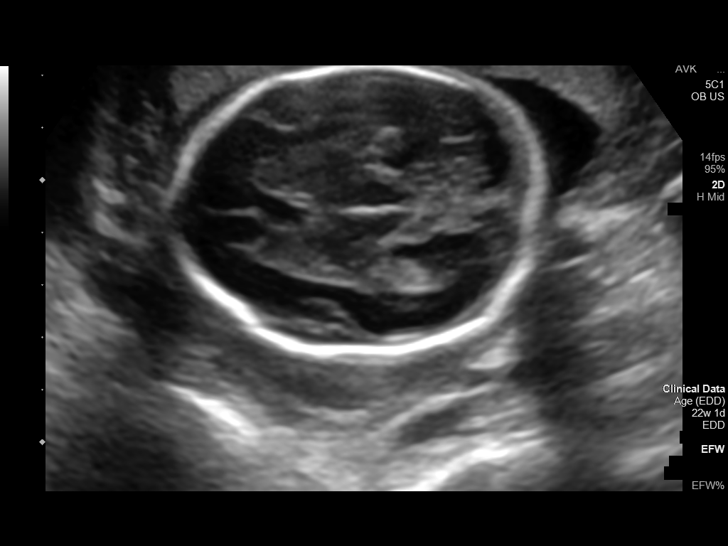
[im 92/96]
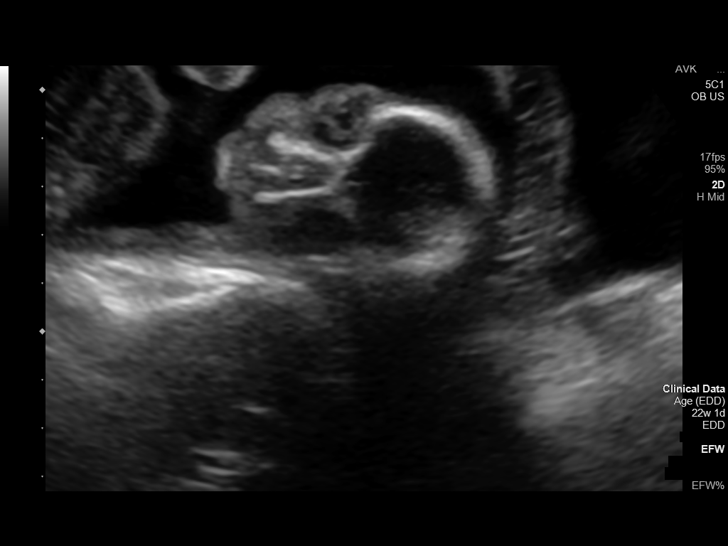

[13 of 28 positions shown; findings below may reference images not displayed]

FINDINGS: Number of Fetuses: 1

Heart Rate:  127 bpm

Movement: Yes

Presentation: Cephalic

Previa: No

Placental Location: Anterior

Amniotic Fluid (Subjective): Within normal limits

Amniotic Fluid (Objective):

Vertical pocket = 4.0cm

FETAL BIOMETRY

BPD: 5.4cm 22w 3d

HC:   20.5cm 22w 4d

AC:   16.9cm 21w 6d

FL:   4.1cm 23w 1d

Current Mean GA: 22w 4d US EDC: 05/02/2021

Assigned GA:  22w 1d Assigned EDC: 05/05/2021

FETAL ANATOMY

Lateral Ventricles: Appears normal

Thalami/CSP: Appears normal

Posterior Fossa:  Appears normal

Nuchal Region: Appears normal   NFT= N/A > 20 WKS

Upper Lip: Appears normal

Spine: Appears normal

4 Chamber Heart on Left: Appears normal

LVOT: Appears normal

RVOT: Appears normal

Stomach on Left: Appears normal

3 Vessel Cord: Appears normal

Cord Insertion site: Appears normal

Kidneys: Appears normal

Bladder: Appears normal

Extremities: Appears normal

Maternal Findings:

Cervix:  3.1 cm TA
IMPRESSION: Assigned GA currently 22 weeks 1 day.  Appropriate fetal growth.

Unremarkable fetal anatomic survey.  No fetal anomalies identified.

## 2022-11-28 LAB — DHEA-SULFATE: DHEA-SO4: 223 ug/dL (ref 57.3–279.2)

## 2022-11-28 LAB — TESTOSTERONE, FREE, TOTAL, SHBG
Sex Hormone Binding: 65.7 nmol/L (ref 24.6–122.0)
Testosterone, Free: 2.2 pg/mL (ref 0.0–4.2)
Testosterone: 44 ng/dL (ref 8–60)

## 2022-11-28 LAB — FSH/LH
FSH: 10 m[IU]/mL
LH: 17.4 m[IU]/mL

## 2022-11-28 LAB — GLUCOSE, FASTING: Glucose, Plasma: 87 mg/dL (ref 70–99)

## 2022-11-28 LAB — TSH: TSH: 3.14 u[IU]/mL (ref 0.450–4.500)

## 2022-11-28 LAB — ANTI MULLERIAN HORMONE: ANTI-MULLERIAN HORMONE (AMH): 3.86 ng/mL

## 2022-11-28 LAB — INSULIN, RANDOM: INSULIN: 6.8 u[IU]/mL (ref 2.6–24.9)

## 2022-11-28 LAB — PROLACTIN: Prolactin: 13.7 ng/mL (ref 4.8–33.4)

## 2022-12-03 NOTE — Progress Notes (Signed)
Your lab work all appears normal at this time.  This does not show any reason why infertility should be an issue. After semen analysis, the neck step would be to have an HSG which is a test to make sure the fallopian tubes are open.

## 2022-12-03 NOTE — Progress Notes (Signed)
Recommendation forwarded to patient via MyChart.

## 2022-12-30 ENCOUNTER — Telehealth: Payer: Self-pay

## 2022-12-30 ENCOUNTER — Ambulatory Visit: Payer: BC Managed Care – PPO | Admitting: Obstetrics and Gynecology

## 2022-12-30 DIAGNOSIS — Z3169 Encounter for other general counseling and advice on procreation: Secondary | ICD-10-CM

## 2022-12-30 NOTE — Telephone Encounter (Signed)
Samantha Newman called triage line stating she's been running a fever and throwing up, she has an appointment today and wants to know if we can do video visit or does she need to reschedule.

## 2022-12-31 ENCOUNTER — Telehealth (INDEPENDENT_AMBULATORY_CARE_PROVIDER_SITE_OTHER): Payer: BC Managed Care – PPO | Admitting: Obstetrics and Gynecology

## 2022-12-31 ENCOUNTER — Encounter: Payer: Self-pay | Admitting: Obstetrics and Gynecology

## 2022-12-31 DIAGNOSIS — N979 Female infertility, unspecified: Secondary | ICD-10-CM

## 2022-12-31 NOTE — Addendum Note (Signed)
Addended by: Marykay Lex on: 12/31/2022 01:32 PM   Modules accepted: Orders

## 2022-12-31 NOTE — Progress Notes (Signed)
Virtual Visit via Video Note  I connected with Samantha Newman on 12/31/22 at  7:30 AM EDT by video and verified that I was speaking with the correct person using two identifiers.    [name] is a 37 y.o. G1P1001 who LMP was No LMP recorded. I discussed the limitations, risks, security and privacy concerns of performing an evaluation and management service by video and the availability of in person appointments. I also discussed with the patient that there may be a patient responsible charge related to this service. The patient expressed understanding and agreed to proceed.  Location of patient: Home  Patient gave explicit verbal consent for video visit:  YES  Location of provider:  [facility] office  Persons other than physician and patient involved in provider conference:  None   Subjective:   History of Present Illness:    She has been attempting fertility.  Ovulation predictor kits show that she is ovulatory every month.  She is having appropriately timed intercourse.  She recently had lab work done and her partner had a semen analysis.  Hx: The following portions of the patient's history were reviewed and updated as appropriate:             She  has a past medical history of Depression and Medical history non-contributory. She does not have any pertinent problems on file. She  has a past surgical history that includes Wisdom tooth extraction; IUD removal (N/A, 05/04/2020); Hysteroscopy (N/A, 05/04/2020); and Open reduction internal fixation (orif) proximal phalanx (Right, 09/03/2020). Her family history includes Breast cancer in her paternal grandmother; Breast cancer (age of onset: 64) in her mother; Cancer in her maternal uncle; Diabetes in her maternal aunt; Healthy in her brother, brother, father, and paternal aunt; Lung cancer in her maternal grandmother. She  reports that she has never smoked. She has never used smokeless tobacco. She reports current alcohol use. She  reports that she does not use drugs. She has a current medication list which includes the following prescription(s): acyclovir, ibuprofen, and prenatal multivitamin. She has No Known Allergies.       Review of Systems:  Review of Systems  Constitutional: Denied constitutional symptoms, night sweats, recent illness, fatigue, fever, insomnia and weight loss.  Eyes: Denied eye symptoms, eye pain, photophobia, vision change and visual disturbance.  Ears/Nose/Throat/Neck: Denied ear, nose, throat or neck symptoms, hearing loss, nasal discharge, sinus congestion and sore throat.  Cardiovascular: Denied cardiovascular symptoms, arrhythmia, chest pain/pressure, edema, exercise intolerance, orthopnea and palpitations.  Respiratory: Denied pulmonary symptoms, asthma, pleuritic pain, productive sputum, cough, dyspnea and wheezing.  Gastrointestinal: Denied, gastro-esophageal reflux, melena, nausea and vomiting.  Genitourinary: Denied genitourinary symptoms including symptomatic vaginal discharge, pelvic relaxation issues, and urinary complaints.  Musculoskeletal: Denied musculoskeletal symptoms, stiffness, swelling, muscle weakness and myalgia.  Dermatologic: Denied dermatology symptoms, rash and scar.  Neurologic: Denied neurology symptoms, dizziness, headache, neck pain and syncope.  Psychiatric: Denied psychiatric symptoms, anxiety and depression.  Endocrine: Denied endocrine symptoms including hot flashes and night sweats.   Meds:   Current Outpatient Medications on File Prior to Visit  Medication Sig Dispense Refill   acyclovir (ZOVIRAX) 400 MG tablet Take 400 mg by mouth at bedtime. As needed     ibuprofen (ADVIL) 600 MG tablet Take 1 tablet (600 mg total) by mouth every 6 (six) hours as needed. 60 tablet 1   Prenatal Vit-Fe Fumarate-FA (PRENATAL MULTIVITAMIN) TABS tablet Take 1 tablet by mouth daily at 12 noon.  No current facility-administered medications on file prior to visit.     Assessment:    G1P1001 Patient Active Problem List   Diagnosis Date Noted   Post-dates pregnancy 05/08/2021     1. Infertility, female, secondary     Her lab work looks good and semen analysis looks good.  She is having appropriately timed intercourse.  Her ovarian reserve looks good.  Plan:            1.  Recommend HSG to determine any physical issue.  If this proves normal consider referral to REI. Orders No orders of the defined types were placed in this encounter.   No orders of the defined types were placed in this encounter.     F/U  Return in about 4 weeks (around 01/28/2023). I spent 22 minutes involved in the care of this patient preparing to see the patient by obtaining and reviewing her medical history (including labs, imaging tests and prior procedures), documenting clinical information in the electronic health record (EHR), counseling and coordinating care plans, writing and sending prescriptions, ordering tests or procedures and in direct communicating with the patient and medical staff discussing pertinent items from her history and physical exam.  Finis Bud, M.D. 12/31/2022 7:50 AM

## 2023-02-02 ENCOUNTER — Ambulatory Visit
Admission: RE | Admit: 2023-02-02 | Discharge: 2023-02-02 | Disposition: A | Discharge status: Home / Self Care | Payer: BC Managed Care – PPO | Source: Ambulatory Visit | Attending: Obstetrics and Gynecology | Admitting: Obstetrics and Gynecology

## 2023-02-02 DIAGNOSIS — N979 Female infertility, unspecified: Secondary | ICD-10-CM | POA: Diagnosis not present

## 2023-02-02 MED ORDER — IOHEXOL 300 MG/ML  SOLN
20.0000 mL | Freq: Once | INTRAMUSCULAR | Status: AC | PRN
  Administered 2023-02-02: 20 mL

## 2023-02-04 NOTE — Progress Notes (Signed)
Samantha Newman I thought at the time of your HSG, it looks completely normal to me.  The uterine cavity looks normal and both fallopian tubes have spillage and appear to be normal diameter. At this point as we discussed at your last visit I think it is probably time to see a reproductive endocrinology infertility expert.  I can place this referral for you if you would like. Let me know. Dr. Logan Bores

## 2023-05-18 ENCOUNTER — Encounter: Payer: Self-pay | Admitting: Obstetrics and Gynecology

## 2023-05-25 ENCOUNTER — Other Ambulatory Visit: Payer: Self-pay

## 2023-05-25 DIAGNOSIS — N979 Female infertility, unspecified: Secondary | ICD-10-CM

## 2023-08-05 ENCOUNTER — Telehealth: Payer: Self-pay | Admitting: Obstetrics and Gynecology

## 2023-08-05 NOTE — Telephone Encounter (Signed)
Left message for patient to call office back to schedule annual appt. Patient is due after 08/15/23

## 2024-02-17 ENCOUNTER — Encounter (HOSPITAL_COMMUNITY): Payer: Self-pay

## 2024-02-24 ENCOUNTER — Ambulatory Visit (INDEPENDENT_AMBULATORY_CARE_PROVIDER_SITE_OTHER): Admitting: Internal Medicine

## 2024-02-24 ENCOUNTER — Encounter: Payer: Self-pay | Admitting: Internal Medicine

## 2024-02-24 VITALS — BP 116/76 | HR 95 | Temp 98.0°F | Ht 59.0 in | Wt 123.4 lb

## 2024-02-24 DIAGNOSIS — E7800 Pure hypercholesterolemia, unspecified: Secondary | ICD-10-CM | POA: Diagnosis not present

## 2024-02-24 DIAGNOSIS — R5383 Other fatigue: Secondary | ICD-10-CM | POA: Diagnosis not present

## 2024-02-24 DIAGNOSIS — N926 Irregular menstruation, unspecified: Secondary | ICD-10-CM

## 2024-02-24 DIAGNOSIS — Z1231 Encounter for screening mammogram for malignant neoplasm of breast: Secondary | ICD-10-CM | POA: Diagnosis not present

## 2024-02-24 DIAGNOSIS — E663 Overweight: Secondary | ICD-10-CM

## 2024-02-24 DIAGNOSIS — N970 Female infertility associated with anovulation: Secondary | ICD-10-CM

## 2024-02-24 DIAGNOSIS — R7301 Impaired fasting glucose: Secondary | ICD-10-CM

## 2024-02-24 NOTE — Patient Instructions (Signed)
 Nice to meet you!  Return for fasting labs at your leisure (4 hours of fasting is enough)    Try adding metamucil to bulk up your stools  A baseline mammogram has been ordered.   PLEASE CALL AND GET THIS [facility] - call [phone]  Tony Frederickson does  the scheduling for [facility] as well  )

## 2024-02-24 NOTE — Progress Notes (Unsigned)
   Subjective:  Patient ID: Samantha Newman, female    DOB: 06/04/1986  Age: 38 y.o. MRN: [id number]  CC: There were no encounter diagnoses.  Infertility: starting IVF in the near future with  [facility] in [place] .  [facility]es [facility]   Right middle finger displaced fature/ injury requiring  surgery,  occurred  3 days prior to her wedding not treated for 6 weeks  IUD retrieval under anesthesia  Taking metformin for elevated testosterone  , not PCOS  prescribed by fertility doc in October.  Bowels more loose   Not exercising regularly due to lack of motivation . 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    HPI [name] presents for  establishment of care   History Samantha Newman has a past medical history of Depression and Medical history non-contributory.   Samantha has a past surgical history that includes Wisdom tooth extraction; IUD removal (N/A, 05/04/2020); Hysteroscopy (N/A, 05/04/2020); and Open reduction internal fixation (orif) proximal phalanx (Right, 09/03/2020).   Her family history includes Breast cancer in her paternal grandmother; Breast cancer (age of onset: 67) in her mother; Cancer in her maternal uncle; Diabetes in her maternal aunt; Healthy in her brother, brother, father, and paternal aunt; Lung cancer in her maternal grandmother.Samantha Newman. Samantha Newman. Samantha reports current alcohol use. Samantha reports that Samantha does not use drugs.  Outpatient Medications Prior to Visit  Medication Sig Dispense Refill   acyclovir (ZOVIRAX) 400 MG tablet Take 400 mg by mouth at bedtime. As needed     estradiol (ESTRACE) 2 MG tablet Take by mouth.     metFORMIN (GLUCOPHAGE) 1000 MG tablet Take by mouth.     ibuprofen  (ADVIL ) 600 MG tablet Take 1 tablet (600 mg total) by mouth every 6 (six) hours as needed. (Patient not taking: Reported on 02/24/2024) 60 tablet 1   Prenatal  Vit-Fe Fumarate-FA (PRENATAL MULTIVITAMIN) TABS tablet Take 1 tablet by mouth daily at 12 noon. (Patient not taking: Reported on 02/24/2024)     No facility-administered medications prior to visit.    Review of Systems:  Patient denies headache, fevers, malaise, unintentional weight loss, skin rash, eye pain, sinus congestion and sinus pain, sore throat, dysphagia,  hemoptysis , cough, dyspnea, wheezing, chest pain, palpitations, orthopnea, edema, abdominal pain, nausea, melena, diarrhea, constipation, flank pain, dysuria, hematuria, urinary  Frequency, nocturia, numbness, tingling, seizures,  Focal weakness, Loss of consciousness,  Tremor, insomnia, depression, anxiety, and suicidal ideation.     Objective:  BP 116/76   Pulse 95   Temp 98 F (36.7 C)   Ht 4\' 11"  (1.499 m)   Wt 123 lb 6.4 oz (56 kg)   SpO2 99%   BMI 24.92 kg/m   Physical Exam  Assessment & Plan:  There are no diagnoses linked to this encounter.   Follow-up: No follow-ups on file.   I provided 30 minutes during this encounter reviewing patient's last visit with previous provider,  most recent imaging studies and labs.  Provided counseling on the above mentioned problems and coordination of care.   Thersia Flax, MD

## 2024-02-25 DIAGNOSIS — E663 Overweight: Secondary | ICD-10-CM | POA: Insufficient documentation

## 2024-02-25 DIAGNOSIS — N970 Female infertility associated with anovulation: Secondary | ICD-10-CM | POA: Insufficient documentation

## 2024-02-25 NOTE — Assessment & Plan Note (Signed)
 Reviewed prior therapy provided with phentermine, which I will not prescrive. Return for thyroid, diabetes screening

## 2024-02-25 NOTE — Assessment & Plan Note (Signed)
 Diagnosis of PCOS unclear.  Taking metformin and prenatal MVI with lans to start IVF.  She will return for labs

## 2024-05-17 ENCOUNTER — Other Ambulatory Visit: Payer: Self-pay | Admitting: Internal Medicine

## 2024-08-18 ENCOUNTER — Other Ambulatory Visit: Payer: Self-pay | Admitting: Internal Medicine

## 2024-08-22 NOTE — Telephone Encounter (Signed)
 Noted

## 2024-08-30 ENCOUNTER — Other Ambulatory Visit

## 2024-08-30 DIAGNOSIS — E7800 Pure hypercholesterolemia, unspecified: Secondary | ICD-10-CM

## 2024-08-30 DIAGNOSIS — R5383 Other fatigue: Secondary | ICD-10-CM

## 2024-08-30 DIAGNOSIS — R7301 Impaired fasting glucose: Secondary | ICD-10-CM | POA: Diagnosis not present

## 2024-08-30 LAB — LDL CHOLESTEROL, DIRECT: Direct LDL: 51 mg/dL

## 2024-08-30 LAB — CBC WITH DIFFERENTIAL/PLATELET
Basophils Absolute: 0 K/uL (ref 0.0–0.1)
Basophils Relative: 0.4 % (ref 0.0–3.0)
Eosinophils Absolute: 0.2 K/uL (ref 0.0–0.7)
Eosinophils Relative: 1.7 % (ref 0.0–5.0)
HCT: 38.3 % (ref 36.0–46.0)
Hemoglobin: 12.8 g/dL (ref 12.0–15.0)
Lymphocytes Relative: 22.3 % (ref 12.0–46.0)
Lymphs Abs: 2.2 K/uL (ref 0.7–4.0)
MCHC: 33.3 g/dL (ref 30.0–36.0)
MCV: 89.8 fl (ref 78.0–100.0)
Monocytes Absolute: 0.7 K/uL (ref 0.1–1.0)
Monocytes Relative: 6.7 % (ref 3.0–12.0)
Neutro Abs: 6.8 K/uL (ref 1.4–7.7)
Neutrophils Relative %: 68.9 % (ref 43.0–77.0)
Platelets: 318 K/uL (ref 150.0–400.0)
RBC: 4.27 Mil/uL (ref 3.87–5.11)
RDW: 12.9 % (ref 11.5–15.5)
WBC: 9.9 K/uL (ref 4.0–10.5)

## 2024-08-30 LAB — COMPREHENSIVE METABOLIC PANEL WITH GFR
ALT: 9 U/L (ref 0–35)
AST: 13 U/L (ref 0–37)
Albumin: 4.2 g/dL (ref 3.5–5.2)
Alkaline Phosphatase: 46 U/L (ref 39–117)
BUN: 14 mg/dL (ref 6–23)
CO2: 25 meq/L (ref 19–32)
Calcium: 9.1 mg/dL (ref 8.4–10.5)
Chloride: 103 meq/L (ref 96–112)
Creatinine, Ser: 0.74 mg/dL (ref 0.40–1.20)
GFR: 102.69 mL/min (ref >60.00)
Glucose, Bld: 83 mg/dL (ref 70–99)
Potassium: 4.2 meq/L (ref 3.5–5.1)
Sodium: 136 meq/L (ref 135–145)
Total Bilirubin: 0.4 mg/dL (ref 0.2–1.2)
Total Protein: 6.9 g/dL (ref 6.0–8.3)

## 2024-08-30 LAB — LIPID PANEL
Cholesterol: 147 mg/dL (ref 0–200)
HDL: 64.8 mg/dL (ref >39.00)
LDL Cholesterol: 48 mg/dL (ref 0–99)
NonHDL: 82.65
Total CHOL/HDL Ratio: 2
Triglycerides: 175 mg/dL — ABNORMAL HIGH (ref 0.0–149.0)
VLDL: 35 mg/dL (ref 0.0–40.0)

## 2024-08-30 LAB — HEMOGLOBIN A1C: Hgb A1c MFr Bld: 5.3 % (ref 4.6–6.5)

## 2024-08-30 LAB — TSH: TSH: 3.48 u[IU]/mL (ref 0.35–5.50)

## 2024-08-31 ENCOUNTER — Ambulatory Visit: Payer: Self-pay | Admitting: Internal Medicine

## 2024-09-01 ENCOUNTER — Encounter: Payer: Self-pay | Admitting: Internal Medicine

## 2024-09-01 ENCOUNTER — Ambulatory Visit (INDEPENDENT_AMBULATORY_CARE_PROVIDER_SITE_OTHER): Admitting: Internal Medicine

## 2024-09-01 VITALS — BP 114/72 | HR 94 | Ht 59.0 in | Wt 123.8 lb

## 2024-09-01 DIAGNOSIS — E663 Overweight: Secondary | ICD-10-CM | POA: Diagnosis not present

## 2024-09-01 DIAGNOSIS — N970 Female infertility associated with anovulation: Secondary | ICD-10-CM

## 2024-09-01 NOTE — Assessment & Plan Note (Addendum)
 Currently managed with metformin,  although the PCOS workup was negative by GYN in Feb 2024

## 2024-09-01 NOTE — Progress Notes (Signed)
 Subjective:  Patient ID: Samantha Newman, female    DOB: Mar 23, 1986  Age: 38 y.o. 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 A diagnosis of Overweight (BMI 25.0-29.9) was also pertinent to this visit.   HPI CRISTYN CROSSNO presents for  Chief Complaint  Patient presents with   Medical Management of Chronic Issues    6 month follow up    Lianna is undergoing IVF for Infertility of uncertain etiology, to hopefully produce a second child.  She has  had 5 IVF cycles,  none worked. Her most recent  IVF with retrieval occurred on Sept  3 with normal embryos retrieved.   Had another egg harvest;were   3 genetically normal ,  had embryo  transfer last sweek, had labs this morning .  Taking progesterone, metformin (to lower testosterone  levels)  and estrogen for the first 8 weeks of gestation   She feels generally tired and malaised and attributes her symptoms , which are mild  to the medications.  She received steroids for several weeks as well.    Outpatient Medications Prior to Visit  Medication Sig Dispense Refill   acyclovir (ZOVIRAX) 400 MG tablet TAKE 1 TABLET BY MOUTH EVERY DAY 90 tablet 1   estradiol (ESTRACE) 2 MG tablet Take by mouth.     estradiol (VIVELLE-DOT) 0.1 MG/24HR patch Place 1 patch onto the skin 2 (two) times a week.     metFORMIN (GLUCOPHAGE) 1000 MG tablet Take by mouth.     Prenatal Vit-Fe Fumarate-FA (PRENATAL MULTIVITAMIN) TABS tablet Take 1 tablet by mouth daily at 12 noon.     PROGESTERONE IM Inject 1 mL into the muscle daily.     ibuprofen  (ADVIL ) 600 MG tablet Take 1 tablet (600 mg total) by mouth every 6 (six) hours as needed. (Patient not taking: Reported on 02/24/2024) 60 tablet 1   No facility-administered medications prior to visit.    Review of Systems;  Patient denies headache, fevers, unintentional weight loss, skin rash, eye pain, sinus congestion and sinus pain, sore throat, dysphagia,   hemoptysis , cough, dyspnea, wheezing, chest pain, palpitations, orthopnea, edema, abdominal pain, nausea, melena, diarrhea, constipation, flank pain, dysuria, hematuria, urinary  Frequency, nocturia, numbness, tingling, seizures,  Focal weakness, Loss of consciousness,  Tremor, insomnia, depression, anxiety, and suicidal ideation.      Objective:  BP 114/72   Pulse 94   Ht 4' 11 (1.499 m)   Wt 123 lb 12.8 oz (56.2 kg)   SpO2 98%   BMI 25.00 kg/m   BP Readings from Last 3 Encounters:  09/01/24 114/72  02/24/24 116/76  11/18/22 132/84    Wt Readings from Last 3 Encounters:  09/01/24 123 lb 12.8 oz (56.2 kg)  02/24/24 123 lb 6.4 oz (56 kg)  11/18/22 136 lb 8 oz (61.9 kg)    Physical Exam Vitals reviewed.  Constitutional:      General: She is not in acute distress.    Appearance: Normal appearance. She is normal weight. She is not ill-appearing, toxic-appearing or diaphoretic.  HENT:     Head: Normocephalic.  Eyes:     General: No scleral icterus.       Right eye: No discharge.        Left eye: No discharge.     Conjunctiva/sclera: Conjunctivae normal.  Cardiovascular:     Rate and Rhythm: Normal rate and regular rhythm.     Heart sounds: Normal heart sounds.  Pulmonary:  Effort: Pulmonary effort is normal. No respiratory distress.     Breath sounds: Normal breath sounds.  Musculoskeletal:        General: Normal range of motion.  Skin:    General: Skin is warm and dry.  Neurological:     General: No focal deficit present.     Mental Status: She is alert and oriented to person, place, and time. Mental status is at baseline.  Psychiatric:        Mood and Affect: Mood normal.        Behavior: Behavior normal.        Thought Content: Thought content normal.        Judgment: Judgment normal.     Lab Results  Component Value Date   HGBA1C 5.3 08/30/2024    Lab Results  Component Value Date   CREATININE 0.74 08/30/2024   CREATININE 0.83 05/02/2020    Lab  Results  Component Value Date   WBC 9.9 08/30/2024   HGB 12.8 08/30/2024   HCT 38.3 08/30/2024   PLT 318.0 08/30/2024   GLUCOSE 83 08/30/2024   CHOL 147 08/30/2024   TRIG 175.0 (H) 08/30/2024   HDL 64.80 08/30/2024   LDLDIRECT 51.0 08/30/2024   LDLCALC 48 08/30/2024   ALT 9 08/30/2024   AST 13 08/30/2024   NA 136 08/30/2024   K 4.2 08/30/2024   CL 103 08/30/2024   CREATININE 0.74 08/30/2024   BUN 14 08/30/2024   CO2 25 08/30/2024   TSH 3.48 08/30/2024   HGBA1C 5.3 08/30/2024    DG Hysterogram (HSG) Result Date: 02/02/2023 CLINICAL DATA:  38 year old female history of infertility. Patient presents for hysterogram for further evaluation EXAM: HYSTEROSALPINGOGRAM TECHNIQUE: Hysterosalpingogram was performed by the ordering physician under fluoroscopy. Fluoroscopic images were submitted for radiologic interpretation following the procedure. Please see the procedural report for the amount of contrast utilized. This exam was performed by Delon Beagle NP and was supervised and interpreted by Dr. Ryan Chess. FLUOROSCOPY: Radiation Exposure Index (as provided by the fluoroscopic device): 7.00 mGy Kerma. Fluoroscopy time: 1.8 minutes. COMPARISON:  None Available. FINDINGS: The endometrial cavity is normal in contour without any filling defects. Contrast is seen in the fallopian tubes bilaterally which are normal in course and caliber. There is free intraperitoneal spillage of contrast bilaterally. IMPRESSION: Normal hysterosalpingogram. Electronically Signed   By: Ryan Chess M.D.   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   thyroid, diabetes screening was negative       Follow-up: Return in about 1 year (around  09/01/2025).   Verneita LITTIE Kettering, MD

## 2024-09-03 NOTE — Assessment & Plan Note (Signed)
 She has normalized her BMI without medications.   thyroid, diabetes screening was negative

## 2025-09-04 ENCOUNTER — Encounter: Admitting: Internal Medicine
